# Patient Record
Sex: Male | Born: 1945 | Race: White | Hispanic: No | Marital: Married | Smoking: Never smoker
Health system: Southern US, Community
[De-identification: ages and names within clinical notes are randomized; demographics above are authoritative.]

## PROBLEM LIST (undated history)

## (undated) DIAGNOSIS — C61 Malignant neoplasm of prostate: Secondary | ICD-10-CM

## (undated) DIAGNOSIS — E785 Hyperlipidemia, unspecified: Secondary | ICD-10-CM

## (undated) DIAGNOSIS — G20A1 Parkinson's disease without dyskinesia, without mention of fluctuations: Secondary | ICD-10-CM

## (undated) DIAGNOSIS — I10 Essential (primary) hypertension: Secondary | ICD-10-CM

## (undated) DIAGNOSIS — G20 Parkinson's disease: Secondary | ICD-10-CM

## (undated) HISTORY — DX: Malignant neoplasm of prostate: C61

## (undated) HISTORY — DX: Parkinson's disease: G20

## (undated) HISTORY — DX: Hyperlipidemia, unspecified: E78.5

## (undated) HISTORY — DX: Parkinson's disease without dyskinesia, without mention of fluctuations: G20.A1

## (undated) HISTORY — DX: Essential (primary) hypertension: I10

---

## 1972-03-22 HISTORY — PX: HERNIA REPAIR: SHX51

## 1997-12-20 ENCOUNTER — Encounter: Payer: Self-pay | Admitting: Family Medicine

## 1997-12-20 LAB — CONVERTED CEMR LAB: PSA: 1 ng/mL

## 1999-01-21 ENCOUNTER — Encounter: Payer: Self-pay | Admitting: Family Medicine

## 1999-01-21 LAB — CONVERTED CEMR LAB: PSA: 0.9 ng/mL

## 2000-01-21 ENCOUNTER — Encounter: Payer: Self-pay | Admitting: Family Medicine

## 2000-01-21 LAB — CONVERTED CEMR LAB: PSA: 1.2 ng/mL

## 2001-09-28 ENCOUNTER — Encounter: Payer: Self-pay | Admitting: Family Medicine

## 2001-09-28 LAB — CONVERTED CEMR LAB: PSA: 7.6 ng/mL

## 2002-02-27 HISTORY — PX: PROSTATE BIOPSY: SHX241

## 2002-08-21 ENCOUNTER — Encounter: Payer: Self-pay | Admitting: Family Medicine

## 2002-08-21 LAB — CONVERTED CEMR LAB: PSA: 0.7 ng/mL

## 2003-01-21 ENCOUNTER — Encounter: Payer: Self-pay | Admitting: Family Medicine

## 2003-01-21 LAB — CONVERTED CEMR LAB
PSA: 0.6 ng/mL
PSA: 0.6 ng/mL

## 2004-07-20 ENCOUNTER — Encounter: Payer: Self-pay | Admitting: Family Medicine

## 2004-07-20 LAB — CONVERTED CEMR LAB: PSA: 0.83 ng/mL

## 2004-07-28 ENCOUNTER — Ambulatory Visit: Payer: Self-pay | Admitting: Family Medicine

## 2004-08-03 ENCOUNTER — Ambulatory Visit: Payer: Self-pay | Admitting: Family Medicine

## 2004-12-20 HISTORY — PX: CATARACT EXTRACTION: SUR2

## 2005-02-08 ENCOUNTER — Ambulatory Visit: Payer: Self-pay | Admitting: Ophthalmology

## 2005-02-19 HISTORY — PX: CATARACT EXTRACTION: SUR2

## 2005-03-01 ENCOUNTER — Ambulatory Visit: Payer: Self-pay | Admitting: Ophthalmology

## 2005-10-20 ENCOUNTER — Encounter: Payer: Self-pay | Admitting: Family Medicine

## 2005-10-20 LAB — CONVERTED CEMR LAB: PSA: 0.86 ng/mL

## 2005-11-10 ENCOUNTER — Ambulatory Visit: Payer: Self-pay | Admitting: Family Medicine

## 2005-11-15 ENCOUNTER — Ambulatory Visit: Payer: Self-pay | Admitting: Family Medicine

## 2005-11-20 LAB — FECAL OCCULT BLOOD, GUAIAC: Fecal Occult Blood: NEGATIVE

## 2005-12-06 ENCOUNTER — Ambulatory Visit: Payer: Self-pay | Admitting: Family Medicine

## 2006-01-14 ENCOUNTER — Ambulatory Visit: Payer: Self-pay | Admitting: Family Medicine

## 2006-01-18 ENCOUNTER — Ambulatory Visit: Payer: Self-pay | Admitting: Family Medicine

## 2006-04-20 ENCOUNTER — Ambulatory Visit: Payer: Self-pay | Admitting: Family Medicine

## 2006-07-14 ENCOUNTER — Ambulatory Visit: Payer: Self-pay | Admitting: Family Medicine

## 2006-07-28 ENCOUNTER — Ambulatory Visit: Payer: Self-pay | Admitting: Internal Medicine

## 2006-10-28 LAB — CONVERTED CEMR LAB: PSA: 1.2 ng/mL

## 2006-11-09 ENCOUNTER — Encounter: Payer: Self-pay | Admitting: Family Medicine

## 2006-11-09 DIAGNOSIS — E785 Hyperlipidemia, unspecified: Secondary | ICD-10-CM | POA: Insufficient documentation

## 2006-11-09 DIAGNOSIS — I10 Essential (primary) hypertension: Secondary | ICD-10-CM | POA: Insufficient documentation

## 2006-11-09 DIAGNOSIS — M109 Gout, unspecified: Secondary | ICD-10-CM | POA: Insufficient documentation

## 2006-11-18 ENCOUNTER — Ambulatory Visit: Payer: Self-pay | Admitting: Family Medicine

## 2006-11-23 ENCOUNTER — Ambulatory Visit: Payer: Self-pay | Admitting: Family Medicine

## 2007-05-23 ENCOUNTER — Ambulatory Visit: Payer: Self-pay | Admitting: Family Medicine

## 2007-05-23 DIAGNOSIS — M25519 Pain in unspecified shoulder: Secondary | ICD-10-CM | POA: Insufficient documentation

## 2007-11-21 ENCOUNTER — Ambulatory Visit: Payer: Self-pay | Admitting: Family Medicine

## 2007-11-22 ENCOUNTER — Encounter: Payer: Self-pay | Admitting: Family Medicine

## 2007-11-30 ENCOUNTER — Ambulatory Visit: Payer: Self-pay | Admitting: Family Medicine

## 2007-12-04 ENCOUNTER — Encounter: Payer: Self-pay | Admitting: Family Medicine

## 2007-12-04 DIAGNOSIS — M19049 Primary osteoarthritis, unspecified hand: Secondary | ICD-10-CM | POA: Insufficient documentation

## 2008-05-09 ENCOUNTER — Ambulatory Visit: Payer: Self-pay | Admitting: Family Medicine

## 2008-11-28 ENCOUNTER — Ambulatory Visit: Payer: Self-pay | Admitting: Family Medicine

## 2008-11-28 LAB — CONVERTED CEMR LAB: PSA: 3.3 ng/mL

## 2008-12-16 ENCOUNTER — Ambulatory Visit: Payer: Self-pay | Admitting: Family Medicine

## 2008-12-17 ENCOUNTER — Telehealth: Payer: Self-pay | Admitting: Family Medicine

## 2008-12-18 ENCOUNTER — Telehealth: Payer: Self-pay | Admitting: Family Medicine

## 2009-03-07 ENCOUNTER — Ambulatory Visit: Payer: Self-pay | Admitting: Family Medicine

## 2009-03-08 LAB — CONVERTED CEMR LAB: PSA: 3.9 ng/mL

## 2009-03-10 ENCOUNTER — Ambulatory Visit: Payer: Self-pay | Admitting: Family Medicine

## 2009-04-14 ENCOUNTER — Encounter: Payer: Self-pay | Admitting: Family Medicine

## 2009-04-21 ENCOUNTER — Ambulatory Visit: Payer: Self-pay | Admitting: Family Medicine

## 2009-05-20 ENCOUNTER — Ambulatory Visit: Payer: Self-pay | Admitting: Urology

## 2009-05-21 ENCOUNTER — Telehealth (INDEPENDENT_AMBULATORY_CARE_PROVIDER_SITE_OTHER): Payer: Self-pay | Admitting: *Deleted

## 2009-05-25 HISTORY — PX: PROSTATECTOMY: SHX69

## 2009-05-26 ENCOUNTER — Ambulatory Visit: Payer: Self-pay | Admitting: Urology

## 2009-07-14 ENCOUNTER — Telehealth: Payer: Self-pay | Admitting: Family Medicine

## 2009-07-23 ENCOUNTER — Ambulatory Visit: Payer: Self-pay | Admitting: Family Medicine

## 2009-07-24 ENCOUNTER — Encounter: Payer: Self-pay | Admitting: Family Medicine

## 2009-10-27 ENCOUNTER — Encounter (INDEPENDENT_AMBULATORY_CARE_PROVIDER_SITE_OTHER): Payer: Self-pay | Admitting: *Deleted

## 2010-01-06 ENCOUNTER — Encounter: Payer: Self-pay | Admitting: Family Medicine

## 2010-01-14 ENCOUNTER — Ambulatory Visit: Payer: Self-pay | Admitting: Internal Medicine

## 2010-04-21 NOTE — Assessment & Plan Note (Signed)
Summary: 6 MONTH FOLLOW UP/[facility]H   Vital Signs:  Patient Profile:   65 Years Old Male Height:     69 inches (175.26 cm) Weight:      225 pounds Temp:     98.2 degrees F oral Pulse rate:   64 / minute Pulse rhythm:   regular BP sitting:   120 / 80  (left arm) Cuff size:   large  Vitals Entered By: Providence Crosby (May 23, 2007 12:21 PM)                 Chief Complaint:  fell hit left arm shoulder canhardly move it today.  History of Present Illness: Here for his usual six ,month f/u, but fell and hit his shoulder, fell yesterday on ice, slipped and caught himself on outstretched palm which impacted his shoulder. Actually caught himself on both wrists but left absorbed most of the impact.  Otherwise has been doing well with no complaints. Fatigue has been pretty good lately. No probs of which he is aware with his BP.    Prior Medications Reviewed Using: Patient Recall  Current Allergies: No known allergies       Physical Exam  General:     Well-developed,well-nourished,in no acute distress; alert,appropriate and cooperative throughout examination Head:     Normocephalic and atraumatic without obvious abnormalities. No apparent alopecia or balding. Eyes:     Conjunctiva clear bilaterally.  Ears:     External ear exam shows no significant lesions or deformities.  Otoscopic examination reveals clear canals, tympanic membranes are intact bilaterally without bulging, retraction, inflammation or discharge. Hearing is grossly normal bilaterally. Nose:     External nasal examination shows no deformity or inflammation. Nasal mucosa are pink and moist without lesions or exudates. Mouth:     Oral mucosa and oropharynx without lesions or exudates.  Teeth in good repair. Neck:     No deformities, masses, or tenderness noted. Chest Wall:     No deformities, masses, tenderness or gynecomastia noted. Lungs:     Normal respiratory effort, chest expands symmetrically. Lungs are  clear to auscultation, no crackles or wheezes. Heart:     Normal rate and regular rhythm. S1 and S2 normal without gallop, murmur, click, rub or other extra sounds. Abdomen:     Bowel sounds positive,abdomen soft and non-tender without masses, organomegaly or hernias noted. Msk:     Shoulders look symmetric with no ant or post subluxation noted. Discomfort is in the rotator cuff area of the left shoulder but most difficulty is with raising arm outstretched in front...cannot get bove 15 degrees without aid of other hand and arm.  Posterior extension reasonably normal, backpocket manuever slightly uncomfortable. Extremities:     No clubbing, cyanosis, edema, or deformity noted with normal full range of motion of all joints except left shoulder.    Impression & Recommendations:  Problem # 1:  SHOULDER PAIN, LEFT (ICD-719.41) Assessment: New Could have been dislocation that reset itself or merely acute strain of shoulder girdle from impact...will give time, use Aleeve 2 after brfst and supper and ice 20 mins at a time every hour for two days and heat  first thing in the AM and a few times a day after the 48 hrs of ice.  Problem # 2:  HYPERTENSION (ICD-401.9) Assessment: Unchanged Stable. His updated medication list for this problem includes:    Metoprolol-hydrochlorothiazide 50-25 Mg Tabs (Metoprolol-hydrochlorothiazide) .Marland Kitchen... Take 1  tablet by mouth at bedtime  BP today: 120/80  Prior BP: 120/80 (11/23/2006)   Problem # 3:  SYMPTOM, MALAISE AND FATIGUE NEC (ICD-780.79) Assessment: Improved Back to baseline.  Complete Medication List: 1)  Metoprolol-hydrochlorothiazide 50-25 Mg Tabs (Metoprolol-hydrochlorothiazide) .... Take 1  tablet by mouth at bedtime 2)  Lipitor 20 Mg Tabs (Atorvastatin calcium) .... Take 1 tablet by mouth at bedtime 3)  Proscar 5 Mg Tabs (Finasteride) .... Take 1 tablet by mouth once a day 4)  Colchicine 0.6 Mg Tabs (Colchicine) .Marland Kitchen.. 1 tab po twice a day as needed  for severe gout pain   Patient Instructions: 1)  RTC 9/09 for complete exam, Labs prior. 2)  Ortho of choice is [facility].    ]

## 2010-04-21 NOTE — Letter (Signed)
Summary: Tristan Booker letter  [place] at [facility]  [address] [place], [place] [address]   Phone: [phone]  Fax: [phone]       10/27/2009 MRN: [id number]  [facility] [address] [place], [place]  [address]  Dear Tristan Booker,  [facility] [place] and [place] announce the retirement of Tristan Booker, M.D., from full-time practice at the [facility] office effective September 18, 2009 and his plans of returning part-time.  It is important to Tristan Booker and to our practice that you understand that [facility] has seven physicians in our office for your health care needs.  We will continue to offer the same exceptional care that you have today.    Tristan Booker has spoken to many of you about his plans for retirement and returning part-time in the fall.   We will continue to work with you through the transition to schedule appointments for you in the office and meet the high standards that [place] is committed to.   Again, it is with great pleasure that we share the news that Tristan Booker will return to [facility] in October of 2011 with a reduced schedule.    If you have any questions, or would like to request an appointment with one of our physicians, please call us at [phone] and press the option for Scheduling an appointment.  We take pleasure in providing you with excellent patient care and look forward to seeing you at your next office visit.  Our [facility] Physicians are:  Tristan Booker, M.D. Tristan Booker, M.D. Tristan Booker, M.D. Tristan Booker, M.D. Tristan Booker, M.D. Tristan Booker, M.D. We proudly welcomed Tristan Booker, M.D. and Tristan Booker, M.D. to the practice in July/August 2011.  Sincerely,  [facility]

## 2010-04-21 NOTE — Assessment & Plan Note (Signed)
Summary: CPX/CLE  R/S FROM 12/02/08   Vital Signs:  Patient profile:   65 year old male Height:      68.5 inches Weight:      223 pounds BMI:     33.53 Temp:     98.2 degrees F oral Pulse rate:   60 / minute Pulse rhythm:   regular BP sitting:   134 / 78  (left arm) Cuff size:   large  Vitals Entered By: Sydell Axon LPN (December 16, 2008 10:40 AM) CC: 30 Minute check-up, hemoccult cards given to patient   History of Present Illness: Pt here for Comp Exam, no complkaints except for aches and pains of aging.  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 One Tab By Mouth At Night 2)  Lipitor 20 Mg Tabs (Atorvastatin Calcium) .... Take 1 Tablet By Mouth At Bedtime 3)  Proscar 5 Mg Tabs (Finasteride) .... Take 1 Tablet By Mouth Once A Day 4)  Colchicine 0.6 Mg Tabs (Colchicine) .Marland Kitchen.. 1 Tab Po Twice A Day As Needed For Severe Gout Pain 5)  Mobic 7.5 Mg Tabs (Meloxicam) .... Daily As Needed Dr. Hyacinth Meeker 6)  Hydrochlorothiazide 12.5 Mg Caps (Hydrochlorothiazide) .... One Tab By Mouth in Am  Allergies: No Known Drug Allergies  Past History:  Past Medical History: Last updated: 11/30/2007 Gout Hyperlipidemia Hypertension Benign prostatic hypertrophy  Past Surgical History: Last updated:  11/30/2007 Hernia repair L 1974 Prostate bx x 6 neg 03/09/2002 Catarract R 12/2004 Catarract L 02/2005  Family History: Last updated: 12/16/2008 Father A 83 Emphysema (Smoker) Mother A 40 Breast Ca s/p Mastect 80s Brother A 62 Arthritis  Social History: Last updated: 11/23/2006 Occupation: Education administrator and window replacement, partime Married Lives with wife   3 Children  Risk Factors: Alcohol Use: 1 (12/16/2008) Caffeine Use: 1 (12/16/2008) Exercise: no (12/16/2008)  Risk Factors: Smoking Status: never (12/16/2008) Passive Smoke Exposure: no (12/16/2008)  Family History: Father A 40 Emphysema (Smoker) Mother A 66 Breast Ca s/p Mastect 77s Brother A 63 Arthritis  Review of Systems General:  Denies chills, fatigue, fever, loss of appetite, malaise, sleep disorder, sweats, weakness, and weight loss. Eyes:  Denies blurring, discharge, double vision, eye irritation, eye pain, halos, itching, light sensitivity, red eye, vision loss-1 eye, and vision loss-both eyes. ENT:  Complains of ringing in ears; denies decreased hearing, difficulty swallowing, ear discharge, earache, hoarseness, nasal congestion, nosebleeds, postnasal drainage, sinus pressure, and sore throat. CV:  Denies bluish discoloration of lips or nails, chest pain or discomfort, difficulty breathing at night, difficulty breathing while lying down, fainting, fatigue, leg cramps with exertion, lightheadness, near fainting, palpitations, shortness of breath with exertion, swelling of feet, swelling of hands, and weight gain. Resp:  Denies chest discomfort, chest pain with inspiration, cough,  coughing up blood, excessive snoring, hypersomnolence, morning headaches, pleuritic, shortness of breath, sputum productive, and wheezing. GI:  Complains of hemorrhoids; denies abdominal pain, bloody stools, change in bowel habits, constipation, dark tarry stools, diarrhea, excessive appetite, gas, indigestion, loss of  appetite, nausea, vomiting, vomiting blood, and yellowish skin color; occas BRBPR . GU:  Denies decreased libido, discharge, dysuria, erectile dysfunction, genital sores, hematuria, incontinence, nocturia, urinary frequency, and urinary hesitancy. MS:  Complains of joint pain and muscle; See HPI. Derm:  Complains of dryness; denies changes in color of skin, changes in nail beds, excessive perspiration, flushing, hair loss, insect bite(s), itching, lesion(s), poor wound healing, and rash; around the eyes, chronic. Neuro:  Denies brief paralysis, difficulty with concentration, disturbances in coordination, falling down, headaches, inability to speak, memory loss, numbness, poor balance, seizures, sensation of room spinning, tingling, tremors, visual disturbances, and weakness.  Physical Exam  General:  Well-developed,well-nourished,in no acute distress; alert,appropriate and cooperative throughout examination Head:  Normocephalic and atraumatic without obvious abnormalities. No apparent alopecia or balding. Thinning of hair midline. Eyes:  Conjunctiva clear bilaterally.  Ears:  External ear exam shows no significant lesions or deformities.  Otoscopic examination reveals clear canals, tympanic membranes are intact bilaterally without bulging, retraction, inflammation or discharge. Hearing is grossly normal bilaterally. Nose:  External nasal examination shows no deformity or inflammation. Nasal mucosa are pink and moist without lesions or exudates. Mouth:  Oral mucosa and oropharynx without lesions or exudates.  Teeth in good repair. Neck:  No deformities, masses, or tenderness noted. Chest Wall:  No deformities, masses, tenderness or gynecomastia noted. Breasts:  No masses or gynecomastia noted Lungs:  Normal respiratory effort, chest expands symmetrically. Lungs are clear to auscultation, no crackles or wheezes. Heart:  Normal rate and regular rhythm. S1 and S2 normal without gallop, murmur, click,  rub or other extra sounds. Abdomen:  Bowel sounds positive,abdomen soft and non-tender without masses, organomegaly or hernias noted. Rectal:  No external abnormalities noted. Normal sphincter tone. No rectal masses or tenderness. G neg. Genitalia:  Testes bilaterally descended without nodularity, tenderness or masses. No scrotal masses or lesions. No penis lesions or urethral discharge. Prostate:  Prostate gland firm and smooth, no enlargement, nodularity, tenderness, mass, asymmetry or induration. 10 gms. Msk:  No deformity or scoliosis noted of thoracic or lumbar spine.   Left shoulder tender over the A/C joint area with mild discomfort on ROM of the upper deltoid fiber area. Pulses:  R and L carotid,radial,femoral,dorsalis pedis and posterior tibial pulses are full and equal bilaterally Extremities:  No clubbing, cyanosis, edema, or deformity noted with normal full range of motion of all joints except mild swelling, mild decreased ROM and significant tenderness to direct palpation of the middle finger MCP joint. Neurologic:  No cranial nerve deficits noted. Station and gait are normal. Plantar reflexes are down-going bilaterally. DTRs are symmetrical throughout. Sensory, motor and coordinative functions appear intact. Skin:  Intact without suspicious lesions or rashes Cervical Nodes:  No lymphadenopathy noted Inguinal Nodes:  No significant adenopathy Psych:  Cognition and judgment appear intact. Alert and cooperative with normal attention span and concentration. No apparent delusions, illusions, hallucinations   Impression & Recommendations:  Problem # 1:  HEALTH MAINTENANCE EXAM (ICD-V70.0) Assessment Comment Only  Problem # 2:  PSA, INCREASED (ICD-790.93) Assessment: New Recheck in 3 mos, no s/s of infection sop will not treat.  Problem # 3:  HAND PAIN, RIGHT (ICD-729.5) Assessment: Unchanged Stable but there.  Problem # 4:  SHOULDER PAIN,  LEFT (ICD-719.41) Assessment:  Unchanged Continues. The following medications were removed from the medication list:    Mobic 7.5 Mg Tabs (Meloxicam) .Marland Kitchen... Daily as needed dr. Hyacinth Meeker  Problem # 5:  PROSTATITIS NOS (ICD-601.9) Assessment: Unchanged No clinical sxs today, no clinical evidence today.  Problem # 6:  HYPERTENSION (ICD-401.9) Assessment: Improved Cont curr meds. His updated medication list for this problem includes:    Metoprolol Succinate 100 Mg Xr24h-tab (Metoprolol succinate) ..... One tab by mouth at night    Hydrochlorothiazide 12.5 Mg Caps (Hydrochlorothiazide) ..... One tab by mouth in am  BP today: 134/78 Prior BP: 144/84 (05/09/2008)  Problem # 7:  HYPERLIPIDEMIA (ICD-272.4) Assessment: Unchanged Stable. His updated medication list for this problem includes:    Lipitor 20 Mg Tabs (Atorvastatin calcium) .Marland Kitchen... Take 1 tablet by mouth at bedtime  Problem # 8:  GOUT (ICD-274.9) Assessment: Unchanged  U.A high but tolerating clinically. The following medications were removed from the medication list:    Mobic 7.5 Mg Tabs (Meloxicam) .Marland Kitchen... Daily as needed dr. Hyacinth Meeker His updated medication list for this problem includes:    Colchicine 0.6 Mg Tabs (Colchicine) .Marland Kitchen... 1 tab by mouthtwice a day as needed for severe gout pain  Complete Medication List: 1)  Metoprolol Succinate 100 Mg Xr24h-tab (Metoprolol succinate) .... One tab by mouth at night 2)  Lipitor 20 Mg Tabs (Atorvastatin calcium) .... Take 1 tablet by mouth at bedtime 3)  Proscar 5 Mg Tabs (Finasteride) .... Take 1 tablet by mouth once a day 4)  Colchicine 0.6 Mg Tabs (Colchicine) .Marland Kitchen.. 1 tab by mouthtwice a day as needed for severe gout pain 5)  Hydrochlorothiazide 12.5 Mg Caps (Hydrochlorothiazide) .... One tab by mouth in am  Patient Instructions: 1)  RTC 3 mos PSA prior 790.93 Prescriptions: HYDROCHLOROTHIAZIDE 12.5 MG CAPS (HYDROCHLOROTHIAZIDE) one tab by mouth in AM  #90 x 3   Entered by:   Sydell Axon LPN   Authorized by:    Shaune Leeks MD   Signed by:   Sydell Axon LPN on 16/12/9602   Method used:   Print then Give to Patient   RxID:   [id number] PROSCAR 5 MG TABS (FINASTERIDE) Take 1 tablet by mouth once a day  #90 x 3   Entered by:   Sydell Axon LPN   Authorized by:   Shaune Leeks MD   Signed by:   Sydell Axon LPN on 21/30/8657   Method used:   Print then Give to Patient   RxID:   [id number] LIPITOR 20 MG TABS (ATORVASTATIN CALCIUM) Take 1 tablet by mouth at bedtime  #90 x 3   Entered by:   Sydell Axon LPN   Authorized by:   Shaune Leeks MD   Signed by:   Sydell Axon LPN on 03/24/7251   Method used:   Print then Give to Patient   RxID:   [id number] METOPROLOL SUCCINATE 100 MG XR24H-TAB (METOPROLOL SUCCINATE) one tab by mouth at night  #90 x 3   Entered by:   Sydell Axon LPN   Authorized by:   Shaune Leeks MD   Signed by:   Sydell Axon LPN on 75/64/3329   Method used:   Print then Give to Patient   RxID:   [id number]   Current Allergies (reviewed today): No known allergies

## 2010-04-21 NOTE — Assessment & Plan Note (Signed)
Summary: CPX/EVE   Vital Signs:  Patient Profile:   65 Years Old Male Height:     69 inches (175.26 cm) Weight:      221 pounds Temp:     98.8 degrees F oral Pulse rate:   68 / minute Pulse rhythm:   regular BP sitting:   120 / 80  (left arm) Cuff size:   large  Vitals Entered By: Providence Crosby (November 23, 2006 8:27 AM)                 Chief Complaint:  CHECK UP // HEMOCULT CARDS TO PATIENT.  History of Present Illness: Here for Comp Exam, no complaints...feels well.  Current Allergies: No known allergies    Family History:    Father A 26 Emphysema (Smoker)    Mother A 55 Breast Ca s/p Mastect 52s    Brother A 60 Arthritis  Social History:    Occupation: Education administrator and window replacement, partime    Married Lives with wife   3 Children   Risk Factors:  Passive smoke exposure:  no Drug use:  no HIV high-risk behavior:  no Caffeine use:  1 drinks per day Alcohol use:  yes    Type:  beer     Drinks per day:  1    Has patient --       Felt need to cut down:  no       Been annoyed by complaints:  no       Felt guilty about drinking:  no       Needed eye opener in the morning:  no    Counseled to quit/cut down alcohol use:  no Exercise:  no Seatbelt use:  100 %   Review of Systems  General      Denies chills, fatigue, fever, loss of appetite, malaise, sleep disorder, sweats, weakness, and weight loss.  Eyes      Denies blurring, discharge, double vision, eye irritation, eye pain, halos, itching, light sensitivity, red eye, vision loss-1 eye, and vision loss-both eyes.  ENT      Complains of ringing in ears.      Denies decreased hearing, difficulty swallowing, ear discharge, earache, hoarseness, nasal congestion, nosebleeds, postnasal drainage, sinus pressure, and sore throat.      chronic  CV      Denies bluish discoloration of lips or nails, chest pain or discomfort, difficulty breathing at night, difficulty breathing while lying  down, fainting, fatigue, leg cramps with exertion, lightheadness, near fainting, palpitations, shortness of breath with exertion, swelling of feet, swelling of hands, and weight gain.  Resp      Denies chest discomfort, chest pain with inspiration, cough, coughing up blood, excessive snoring, hypersomnolence, morning headaches, pleuritic, shortness of breath, sputum productive, and wheezing.  GI      Denies abdominal pain, bloody stools, change in bowel habits, constipation, dark tarry stools, diarrhea, excessive appetite, gas, hemorrhoids, indigestion, loss of appetite, nausea, vomiting, vomiting blood, and yellowish skin color.  GU      Denies decreased libido, discharge, dysuria, erectile dysfunction, genital sores, hematuria, incontinence, nocturia, urinary frequency, and urinary hesitancy.  MS      Denies joint pain, joint redness, joint swelling, loss of strength, low back pain, mid back pain, muscle aches, muscle , cramps, muscle weakness, stiffness, and thoracic pain.  Derm      Denies changes in color of skin, changes in nail beds, dryness, excessive perspiration, flushing, hair loss,  insect bite(s), itching, lesion(s), poor wound healing, and rash.  Neuro      Denies brief paralysis, difficulty with concentration, disturbances in coordination, falling down, headaches, inability to speak, memory loss, numbness, poor balance, seizures, sensation of room spinning, tingling, tremors, visual disturbances, and weakness.   Physical Exam  General:     Well-developed,well-nourished,in no acute distress; alert,appropriate and cooperative throughout examination Head:     Normocephalic and atraumatic without obvious abnormalities. No apparent alopecia or balding. Eyes:     Conjunctiva clear bilaterally.  Ears:     External ear exam shows no significant lesions or deformities.  Otoscopic examination reveals clear canals, tympanic membranes are intact bilaterally without bulging,  retraction, inflammation or discharge. Hearing is grossly normal bilaterally. Nose:     External nasal examination shows no deformity or inflammation. Nasal mucosa are pink and moist without lesions or exudates. Mouth:     Oral mucosa and oropharynx without lesions or exudates.  Teeth in good repair. Neck:     No deformities, masses, or tenderness noted. Chest Wall:     no tenderness.   Breasts:     No masses or gynecomastia noted Lungs:     Normal respiratory effort, chest expands symmetrically. Lungs are clear to auscultation, no crackles or wheezes. Heart:     Normal rate and regular rhythm. S1 and S2 normal without gallop, murmur, click, rub or other extra sounds. Abdomen:     Bowel sounds positive,abdomen soft and non-tender without masses, organomegaly or hernias noted. Rectal:     No external abnormalities noted. Normal sphincter tone. No rectal masses or tenderness. Gneg. Genitalia:     Testes bilaterally descended without nodularity, tenderness or masses. No scrotal masses or lesions. No penis lesions or urethral discharge. Prostate:     Prostate gland firm and smooth, no enlargement, nodularity, tenderness, mass, asymmetry or induration. 10gms. Msk:     No deformity or scoliosis noted of thoracic or lumbar spine.   Pulses:     R and L carotid,radial,femoral,dorsalis pedis and posterior tibial pulses are full and equal bilaterally Extremities:     No clubbing, cyanosis, edema, or deformity noted with normal full range of motion of all joints.   Neurologic:     No cranial nerve deficits noted. Station and gait are normal. Plantar reflexes are down-going bilaterally. DTRs are symmetrical throughout. Sensory, motor and coordinative functions appear intact. Skin:     Intact without suspicious lesions or rashes except mild AKs and benign moles. Cervical Nodes:     No lymphadenopathy noted Inguinal Nodes:     No significant adenopathy Psych:     Cognition and judgment appear  intact. Alert and cooperative with normal attention span and concentration. No apparent delusions, illusions, hallucinations    Impression & Recommendations:  Problem # 1:  HEALTH MAINTENANCE EXAM (ICD-V70.0) Assessment: Comment Only  Problem # 2:  SYMPTOM, MALAISE AND FATIGUE NEC (ICD-780.79) Assessment: Unchanged Labs normal.  Problem # 3:  BENIGN PROSTATIC HYPERTROPHY (ICD-600.00) Assessment: Improved Continue Proscar.  Problem # 4:  HYPERTENSION (ICD-401.9) Assessment: Improved Cont current meds. His updated medication list for this problem includes:    Metoprolol-hydrochlorothiazide 50-25 Mg Tabs (Metoprolol-hydrochlorothiazide) .Marland Kitchen... Take 1  tablet by mouth at bedtime  BP today: 120/80 Prior BP: 132/70 (07/28/2006)   Problem # 5:  HYPERLIPIDEMIA (ICD-272.4) Good control. His updated medication list for this problem includes:    Lipitor 20 Mg Tabs (Atorvastatin calcium) .Marland Kitchen... Take 1 tablet by mouth at bedtime   Problem #  6:  GOUT (ICD-274.9) Assessment: Unchanged Uric acid elevated but acute attacks have not been real frequent...will continue as needed colchicine. His updated medication list for this problem includes:    Colchicine 0.6 Mg Tabs (Colchicine) .Marland Kitchen... 1 tab po twice a day as needed for severe gout pain Elevate extremity; warm compresses, symptomatic relief and medication as directed.   Complete Medication List: 1)  Metoprolol-hydrochlorothiazide 50-25 Mg Tabs (Metoprolol-hydrochlorothiazide) .... Take 1  tablet by mouth at bedtime 2)  Lipitor 20 Mg Tabs (Atorvastatin calcium) .... Take 1 tablet by mouth at bedtime 3)  Proscar 5 Mg Tabs (Finasteride) .... Take 1 tablet by mouth once a day 4)  Colchicine 0.6 Mg Tabs (Colchicine) .Marland Kitchen.. 1 tab po twice a day as needed for severe gout pain   Patient Instructions: 1)  RTC 6 mos    Prescriptions: COLCHICINE 0.6 MG TABS (COLCHICINE) 1 three times a day as needed for severe gout pain  #60 x 0   Entered by:    Providence Crosby   Authorized by:   Shaune Leeks MD   Signed by:   Providence Crosby on 11/23/2006   Method used:   Print then Give to Patient   RxID:   [id number] PROSCAR 5 MG TABS (FINASTERIDE) Take 1 tablet by mouth once a day  #90 x 4   Entered by:   Providence Crosby   Authorized by:   Shaune Leeks MD   Signed by:   Providence Crosby on 11/23/2006   Method used:   Print then Give to Patient   RxID:   [id number] LIPITOR 20 MG TABS (ATORVASTATIN CALCIUM) Take 1 tablet by mouth at bedtime  #90 x 4   Entered by:   Providence Crosby   Authorized by:   Shaune Leeks MD   Signed by:   Providence Crosby on 11/23/2006   Method used:   Print then Give to Patient   RxID:   [id number] METOPROLOL-HYDROCHLOROTHIAZIDE 50-25 MG TABS (METOPROLOL-HYDROCHLOROTHIAZIDE) Take 1  tablet by mouth at bedtime  #90 x 4   Entered by:   Providence Crosby   Authorized by:   Shaune Leeks MD   Signed by:   Providence Crosby on 11/23/2006   Method used:   Print then Give to Patient   RxID:   [id number] METOPROLOL-HYDROCHLOROTHIAZIDE 50-25 MG TABS (METOPROLOL-HYDROCHLOROTHIAZIDE) Take 1  tablet by mouth at bedtime  #30 x 3   Entered by:   Providence Crosby   Authorized by:   Shaune Leeks MD   Signed by:   Providence Crosby on 11/23/2006   Method used:   Print then Give to Patient   RxID:   [id number]

## 2010-04-21 NOTE — Progress Notes (Signed)
Summary: Alternative for Lipitor  Phone Note From Pharmacy Call back at [phone]   Caller: [facility] Call For: Dr. Hetty Ely  Summary of Call: Received faxed form requesting an alternative for Lipitor.  Please advise form in your IN box. Initial call taken by: Linde Gillis CMA Duncan Dull),  July 14, 2009 11:33 AM  Follow-up for Phone Call        Pls have pt come in. Follow-up by: Shaune Leeks MD,  July 14, 2009 1:24 PM  Additional Follow-up for Phone Call Additional follow up Details #1::        Scheduled appt for patient on 07/23/2009 at 2:45. Additional Follow-up by: Linde Gillis CMA Duncan Dull),  July 14, 2009 2:05 PM

## 2010-04-21 NOTE — Assessment & Plan Note (Signed)
Summary: CPX/RBH   Vital Signs:  Patient Profile:   65 Years Old Male Height:     69 inches (175.26 cm) Weight:      223 pounds Temp:     98 degrees F oral Pulse rate:   56 / minute Pulse rhythm:   regular BP sitting:   140 / 80  (left arm) Cuff size:   large  Vitals Entered By: Providence Crosby (November 30, 2007 8:22 AM)                 Chief Complaint:  check up// hemoccult cards to patient.  History of Present Illness: Pt here for Comp Exam, Having continued left shoulder tightness after a fall six months ago....better but still hurts. Also has right hand pain. Longstanding but worsening discomfort of the third MCP joint, worse with direct palpation, both dorsal and palmar surfaces. His BP is also up a little today, no known reason other than being here early in AM for PE!    Prior Medications Reviewed Using: Medication Bottles  Current Allergies: No known allergies   Past Medical History:    Gout    Hyperlipidemia    Hypertension    Benign prostatic hypertrophy  Past Surgical History:    Hernia repair L 1974    Prostate bx x 6 neg 03/09/2002    Catarract R 12/2004    Catarract L 02/2005       Family History:    Father A 77 Emphysema (Smoker)    Mother A 33 Breast Ca s/p Mastect 60s    Brother A 62 Arthritis   Risk Factors:     Has patient --       Felt need to cut down:  no       Been annoyed by complaints:  no       Felt guilty about drinking:  no       Needed eye opener in the morning:  no    Counseled to quit/cut down alcohol use:  no   Review of Systems  Eyes      Denies blurring, discharge, double vision, eye irritation, eye pain, halos, itching, light sensitivity, red eye, vision loss-1 eye, and vision loss-both eyes.  ENT      Complains of ringing in ears.      Denies decreased hearing, difficulty swallowing, ear discharge, earache, hoarseness, nasal congestion, nosebleeds, postnasal drainage, sinus pressure, and sore throat.  longstanding  CV      Denies bluish discoloration of lips or nails, chest pain or discomfort, difficulty breathing at night, difficulty breathing while lying down, fainting, fatigue, leg cramps with exertion, lightheadness, near fainting, palpitations, shortness of breath with exertion, swelling of feet, swelling of hands, and weight gain.  Resp      Complains of wheezing.      Denies chest discomfort, chest pain with inspiration, cough, coughing up blood, excessive snoring, hypersomnolence, morning headaches, pleuritic, shortness of breath, and sputum productive.      late at night or early in the morning  GI      Denies abdominal pain, bloody stools, change in bowel habits, constipation, dark tarry stools, diarrhea, excessive appetite, gas, hemorrhoids, indigestion, loss of appetite, nausea, vomiting, vomiting blood, and yellowish skin color.  GU      Denies decreased libido, discharge, dysuria, erectile dysfunction, genital sores, hematuria, incontinence, nocturia, urinary frequency, and urinary hesitancy.  MS      Complains of joint pain.      right  hand and left shoulder.  Derm      Complains of dryness.      Denies changes in color of skin, changes in nail beds, excessive perspiration, flushing, hair loss, insect bite(s), itching, lesion(s), poor wound healing, and rash.      top of nose and eyebrows  Neuro      Denies brief paralysis, difficulty with concentration, disturbances in coordination, falling down, headaches, inability to speak, memory loss, numbness, poor balance, seizures, sensation of room spinning, tingling, tremors, visual disturbances, and weakness.   Physical Exam  General:     Well-developed,well-nourished,in no acute distress; alert,appropriate and cooperative throughout examination Head:     Normocephalic and atraumatic without obvious abnormalities. No apparent alopecia or balding. Eyes:     Conjunctiva clear bilaterally.  Ears:     External ear exam  shows no significant lesions or deformities.  Otoscopic examination reveals clear canals, tympanic membranes are intact bilaterally without bulging, retraction, inflammation or discharge. Hearing is grossly normal bilaterally. Nose:     External nasal examination shows no deformity or inflammation. Nasal mucosa are pink and moist without lesions or exudates. Mouth:     Oral mucosa and oropharynx without lesions or exudates.  Teeth in good repair. Neck:     No deformities, masses, or tenderness noted. Chest Wall:     No deformities, masses, tenderness or gynecomastia noted. Breasts:     No masses or gynecomastia noted Lungs:     Normal respiratory effort, chest expands symmetrically. Lungs are clear to auscultation, no crackles or wheezes. Heart:     Normal rate and regular rhythm. S1 and S2 normal without gallop, murmur, click, rub or other extra sounds. Abdomen:     Bowel sounds positive,abdomen soft and non-tender without masses, organomegaly or hernias noted. Rectal:     No external abnormalities noted. Normal sphincter tone. No rectal masses or tenderness. G neg. Genitalia:     Testes bilaterally descended without nodularity, tenderness or masses. No scrotal masses or lesions. No penis lesions or urethral discharge. Prostate:     Prostate gland firm and smooth, no enlargement, nodularity, tenderness, mass, asymmetry or induration. 10 gms. Msk:     No deformity or scoliosis noted of thoracic or lumbar spine.   Left shoulder tender over the A/C joint area with mild discomfort on ROM of the upper deltoid fiber area. Pulses:     R and L carotid,radial,femoral,dorsalis pedis and posterior tibial pulses are full and equal bilaterally Extremities:     No clubbing, cyanosis, edema, or deformity noted with normal full range of motion of all joints except mild swelling, mild decreased ROM and significant tenderness to direct palpation of the middle finger MCP joint. Neurologic:     No  cranial nerve deficits noted. Station and gait are normal. Plantar reflexes are down-going bilaterally. DTRs are symmetrical throughout. Sensory, motor and coordinative functions appear intact. Skin:     Intact without suspicious lesions or rashes Cervical Nodes:     No lymphadenopathy noted Inguinal Nodes:     No significant adenopathy Psych:     Cognition and judgment appear intact. Alert and cooperative with normal attention span and concentration. No apparent delusions, illusions, hallucinations    Impression & Recommendations:  Problem # 1:  HEALTH MAINTENANCE EXAM (ICD-V70.0) Assessment: Comment Only Tdap today.  Problem # 2:  HAND PAIN, RIGHT (ICD-729.5) Assessment: New New complaint but old problem per his history, getting progressively worse. Will refer. Orders: Orthopedic Referral (Ortho)   Problem #  3:  SHOULDER PAIN, LEFT (ICD-719.41) Assessment: Improved Start doing gentle ROM exercises as shown after heat application daily.  Problem # 4:  BENIGN PROSTATIC HYPERTROPHY (ICD-600.00) Assessment: Unchanged Stable, urinating well.  Problem # 5:  HYPERTENSION (ICD-401.9) Assessment: Deteriorated Mildly elevated today, will follow and adjust meds next time if still elevated. His updated medication list for this problem includes:    Metoprolol-hydrochlorothiazide 50-25 Mg Tabs (Metoprolol-hydrochlorothiazide) .Marland Kitchen... Take 1  tablet by mouth at bedtime  BP today: 140/80 Prior BP: 120/80 (05/23/2007)   Problem # 6:  HYPERLIPIDEMIA (ICD-272.4) Assessment: Unchanged Stable, trig mildly elevated...watch sweets and carbs. His updated medication list for this problem includes:    Lipitor 20 Mg Tabs (Atorvastatin calcium) .Marland Kitchen... Take 1 tablet by mouth at bedtime   Problem # 7:  GOUT (ICD-274.9) Assessment: Unchanged Uric acid high but flares sparse. Will follow. His updated medication list for this problem includes:    Colchicine 0.6 Mg Tabs (Colchicine) .Marland Kitchen... 1 tab po  twice a day as needed for severe gout pain   Complete Medication List: 1)  Metoprolol-hydrochlorothiazide 50-25 Mg Tabs (Metoprolol-hydrochlorothiazide) .... Take 1  tablet by mouth at bedtime 2)  Lipitor 20 Mg Tabs (Atorvastatin calcium) .... Take 1 tablet by mouth at bedtime 3)  Proscar 5 Mg Tabs (Finasteride) .... Take 1 tablet by mouth once a day 4)  Colchicine 0.6 Mg Tabs (Colchicine) .Marland Kitchen.. 1 tab po twice a day as needed for severe gout pain   Patient Instructions: 1)  Refer to Ortho for right hand 3rd MTP joint pain and swelling 2)  RTC 6 mos , check BP. 3)  Tdap today.   Prescriptions: PROSCAR 5 MG TABS (FINASTERIDE) Take 1 tablet by mouth once a day  #90 x 4   Entered by:   Providence Crosby   Authorized by:   Shaune Leeks MD   Signed by:   Providence Crosby on 11/30/2007   Method used:   Print then Give to Patient   RxID:   [id number] LIPITOR 20 MG TABS (ATORVASTATIN CALCIUM) Take 1 tablet by mouth at bedtime  #90 x 4   Entered by:   Providence Crosby   Authorized by:   Shaune Leeks MD   Signed by:   Providence Crosby on 11/30/2007   Method used:   Print then Give to Patient   RxID:   [id number] METOPROLOL-HYDROCHLOROTHIAZIDE 50-25 MG TABS (METOPROLOL-HYDROCHLOROTHIAZIDE) Take 1  tablet by mouth at bedtime  #90 x 4   Entered by:   Providence Crosby   Authorized by:   Shaune Leeks MD   Signed by:   Providence Crosby on 11/30/2007   Method used:   Print then Give to Patient   RxID:   [id number]  ]  Appended Document: CPX/RBH        Current Allergies: No known allergies         Complete Medication List: 1)  Metoprolol-hydrochlorothiazide 50-25 Mg Tabs (Metoprolol-hydrochlorothiazide) .... Take 1  tablet by mouth at bedtime 2)  Lipitor 20 Mg Tabs (Atorvastatin calcium) .... Take 1 tablet by mouth at bedtime 3)  Proscar 5 Mg Tabs (Finasteride) .... Take 1 tablet by mouth once a day 4)  Colchicine 0.6 Mg Tabs (Colchicine) .Marland Kitchen.. 1 tab po twice a day as  needed for severe gout pain    ]  Tetanus/Td Vaccine    Vaccine Type: Tdap    Site: right deltoid    Mfr: San[employer]i Pasteur    Dose: 0.5 ml  Route: IM    Given by: Providence Crosby    Exp. Date: 05/18/2009    Lot #: [id number]    VIS given: 02/07/07 version given November 30, 2007.

## 2010-04-21 NOTE — Assessment & Plan Note (Signed)
Summary: 6WK FOLLOW UP / LFW   Vital Signs:  Patient profile:   65 year old male Weight:      225.75 pounds Temp:     98.5 degrees F oral Pulse rate:   64 / minute Pulse rhythm:   regular BP sitting:   122 / 72  (left arm) Cuff size:   large  Vitals Entered By: Sydell Axon LPN (April 21, 2009 8:54 AM) CC: 6 week follow-up   History of Present Illness: Pt here for followup on elevated PSA that has been assymptomatic but treated for infection anyway and now back for recheck. His PSA is elevted further, 4.7 but with great free PSA. These results are conflicting. He has been seen by Dr Sheppard Penton in [place] previously with biopsies before. He has no complaints today.  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 One Tab By Mouth At Night 2)  Lipitor 20 Mg Tabs (Atorvastatin Calcium) .... Take 1 Tablet By Mouth At Bedtime 3)  Proscar 5 Mg Tabs (Finasteride) .... Take 1 Tablet By Mouth Once A Day 4)  Colchicine 0.6 Mg Tabs (Colchicine) .Marland Kitchen.. 1 Tab By Mouthtwice A Day As Needed For Severe Gout Pain 5)  Hydrochlorothiazide 12.5 Mg Caps (Hydrochlorothiazide) .... One Tab By Mouth in Am 6)  Cipro 500 Mg Tabs (Ciprofloxacin Hcl) .... One Tab By Mouth Two Times A Day  Allergies: No Known Drug Allergies  Physical Exam  General:  Well-developed,well-nourished,in no acute distress; alert,appropriate and cooperative throughout examination Head:  Normocephalic and atraumatic without obvious abnormalities. No apparent alopecia or  balding. Thinning of hair midline. Eyes:  Conjunctiva clear bilaterally.  Ears:  External ear exam shows no significant lesions or deformities.  Otoscopic examination reveals clear canals, tympanic membranes are intact bilaterally without bulging, retraction, inflammation or discharge. Hearing is grossly normal bilaterally. Nose:  External nasal examination shows no deformity or inflammation. Nasal mucosa are pink and moist without lesions or exudates. Mouth:  Oral mucosa and oropharynx without lesions or exudates.  Teeth in good repair.   Impression & Recommendations:  Problem # 1:  PSA, INCREASED (ICD-790.93) Assessment Deteriorated Has accelerated more. But free great. Will refer rather than watch. Pt agrees. Orders: Urology Referral (Urology)  Complete Medication List: 1)  Metoprolol Succinate 100 Mg Xr24h-tab (Metoprolol succinate) .... One tab by mouth at night 2)  Lipitor 20 Mg Tabs (Atorvastatin calcium) .... Take 1 tablet by mouth at bedtime 3)  Proscar 5 Mg Tabs (Finasteride) .... Take 1 tablet by mouth once a day 4)  Colchicine 0.6 Mg Tabs (Colchicine) .Marland Kitchen.. 1 tab by mouthtwice a day as needed for severe gout pain 5)  Hydrochlorothiazide 12.5 Mg Caps (Hydrochlorothiazide) .... One tab by mouth in am 6)  Cipro 500 Mg Tabs (Ciprofloxacin hcl) .... One tab by mouth two times a day  Patient Instructions: 1)  Refer to Dr Sheppard Penton in [place]. 2)  RTC as needed.  Current Allergies (reviewed today): No known allergies

## 2010-04-21 NOTE — Assessment & Plan Note (Signed)
Summary: RETURN OFFICE VISIT   Vital Signs:  Patient profile:   65 year old male Weight:      227 pounds Temp:     98.6 degrees F oral Pulse rate:   60 / minute Pulse rhythm:   regular BP sitting:   122 / 82  (left arm) Cuff size:   large  Vitals Entered By: Sydell Axon LPN (March 10, 2009 11:59 AM) CC: follow-up visit   History of Present Illness: Pt here for three month followup of elevated PSA. He has had this previously, was bioppsied and also put on Ab and biopsy was benign and PSA normalized on Ab. He has no rectal pain, no fever and feels well.  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 One Tab By Mouth At Night 2)  Lipitor 20 Mg Tabs (Atorvastatin Calcium) .... Take 1 Tablet By Mouth At Bedtime 3)  Proscar 5 Mg Tabs (Finasteride) .... Take 1 Tablet By Mouth Once A Day 4)  Colchicine 0.6 Mg Tabs (Colchicine) .Marland Kitchen.. 1 Tab By Mouthtwice A Day As Needed For Severe Gout Pain 5)  Hydrochlorothiazide 12.5 Mg Caps (Hydrochlorothiazide) .... One Tab By Mouth in Am  Allergies: No Known Drug Allergies  Physical Exam  General:  Well-developed,well-nourished,in no acute distress; alert,appropriate and cooperative throughout examination Head:  Normocephalic and atraumatic without obvious abnormalities. No apparent alopecia or balding. Thinning of hair midline. Eyes:  Conjunctiva clear bilaterally.  Ears:  External ear exam shows no significant lesions or deformities.  Otoscopic examination reveals clear canals,  tympanic membranes are intact bilaterally without bulging, retraction, inflammation or discharge. Hearing is grossly normal bilaterally. Nose:  External nasal examination shows no deformity or inflammation. Nasal mucosa are pink and moist without lesions or exudates. Mouth:  Oral mucosa and oropharynx without lesions or exudates.  Teeth in good repair.   Impression & Recommendations:  Problem # 1:  PROSTATITIS NOS (ICD-601.9) Assessment Unchanged Recurrent Will treat with AB for three weeks and then rechck PSA three weeks later. If still elevated, refer again to Urology.  Complete Medication List: 1)  Metoprolol Succinate 100 Mg Xr24h-tab (Metoprolol succinate) .... One tab by mouth at night 2)  Lipitor 20 Mg Tabs (Atorvastatin calcium) .... Take 1 tablet by mouth at bedtime 3)  Proscar 5 Mg Tabs (Finasteride) .... Take 1 tablet by mouth once a day 4)  Colchicine 0.6 Mg Tabs (Colchicine) .Marland Kitchen.. 1 tab by mouthtwice a day as needed for severe gout pain 5)  Hydrochlorothiazide 12.5 Mg Caps (Hydrochlorothiazide) .... One tab by mouth in am 6)  Cipro 500 Mg Tabs (Ciprofloxacin hcl) .... One tab by mouth two times a day  Patient Instructions: 1)  RTC 6 weeks, PSA , free PSA prior. Prescriptions: CIPRO 500 MG TABS (CIPROFLOXACIN HCL) one tab by mouth two times a day  #42 x 0   Entered and Authorized by:   Shaune Leeks MD   Signed by:   Shaune Leeks MD on 03/10/2009   Method used:   Electronically to  [facility]  [address] (retail)       [address], [address]       [place], [place]  [address]       Ph: [phone]       Fax: [phone]   RxID:   [id number]   Current Allergies (reviewed today): No known allergies

## 2010-04-21 NOTE — Consult Note (Signed)
Summary: [facility] RIGHT HAND PAIN / DR. HE  [facility] - RIGHT HAND PAIN / DR. HE MILLER   Imported By: Carin Primrose 12/08/2007 15:01:21  _____________________________________________________________________  External Attachment:    Type:   Image     Comment:   External Document  Appended Document:  [facility] - RIGHT HAND PAIN / DR. HE    Clinical Lists Changes  Problems: Added new problem of POSTTRAUMATIC ARTHOPATHY,R MIDDLE FINGER MCP JOINT [phone])

## 2010-04-21 NOTE — Progress Notes (Signed)
Summary: prior auth needed for lipitor  Phone Note From Pharmacy   Caller: [facility] [address] Summary of Call: Prior Berkley Harvey is needed for lipitor, form is on your shelf. Initial call taken by: Lowella Petties CMA,  December 18, 2008 12:21 PM  Follow-up for Phone Call        Pls send med list, prob list and last labs. Follow-up by: Shaune Leeks MD,  December 18, 2008 1:43 PM  Additional Follow-up for Phone Call Additional follow up Details #1::        Information faxed yesterday. Additional Follow-up by: Lowella Petties CMA,  December 20, 2008 10:13 AM

## 2010-04-21 NOTE — Assessment & Plan Note (Signed)
Summary: FOLLOW UP, DISCUSS ALTERNATIVE FOR LIPITOR/NT   Vital Signs:  Patient profile:   65 year old male Weight:      226 pounds Temp:     98.8 degrees F oral Pulse rate:   60 / minute Pulse rhythm:   regular BP sitting:   122 / 72  (left arm) Cuff size:   large  Vitals Entered By: Sydell Axon LPN (Jul 23, 9145 2:42 PM) CC: Discuss alternative for Lipitor   History of Present Illness: Pt here as Lipitor requires Precert and has been refused. Pt has never been on alternative med. After discussing, pt willing to try generic.  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 One Tab By Mouth At Night 2)  Lipitor 20 Mg Tabs (Atorvastatin Calcium) .... Take 1 Tablet By Mouth At Bedtime 3)  Proscar 5 Mg Tabs (Finasteride) .... Take 1 Tablet By Mouth Once A Day 4)  Colchicine 0.6 Mg Tabs (Colchicine) .Marland Kitchen.. 1 Tab By Mouthtwice A Day As Needed For Severe Gout Pain 5)  Hydrochlorothiazide 12.5 Mg Caps (Hydrochlorothiazide) .... One Tab By Mouth in Am 6)  Cipro 500 Mg Tabs (Ciprofloxacin Hcl) .... One Tab By Mouth Two Times A Day  Allergies: No Known Drug Allergies  Physical Exam  General:  Well-developed,well-nourished,in no acute distress; alert,appropriate and cooperative throughout examination Head:  Normocephalic and atraumatic without obvious abnormalities. No apparent alopecia or balding. Thinning of hair midline. Eyes:  Conjunctiva clear bilaterally.  Ears:  External ear exam shows no significant lesions or deformities.   Otoscopic examination reveals clear canals, tympanic membranes are intact bilaterally without bulging, retraction, inflammation or discharge. Hearing is grossly normal bilaterally. Nose:  External nasal examination shows no deformity or inflammation. Nasal mucosa are pink and moist without lesions or exudates. Mouth:  Oral mucosa and oropharynx without lesions or exudates.  Teeth in good repair.   Impression & Recommendations:  Problem # 1:  HYPERLIPIDEMIA (ICD-272.4) Assessment Unchanged Will try substituting Simvastatin 40 for Lipitor 20. Get chol prof now for baseline, start medication and then SGOT, SGPT in 6 weeks. Get Chol profile and repeatt SGOT, SGPT for PE scheduled with Dr Alphonsus Sias in Jul. His updated medication list for this problem includes:    Lipitor 20 Mg Tabs (Atorvastatin calcium) .Marland Kitchen... Take 1 tablet by mouth at bedtime    Simvastatin 40 Mg Tabs (Simvastatin) ..... One tab by mouth at night  Complete Medication List: 1)  Metoprolol Succinate 100 Mg Xr24h-tab (Metoprolol succinate) .... One tab by mouth at night 2)  Lipitor 20 Mg Tabs (Atorvastatin calcium) .... Take 1 tablet by mouth at bedtime 3)  Colchicine 0.6 Mg Tabs (Colchicine) .Marland Kitchen.. 1 tab by mouthtwice a day as needed for severe gout pain 4)  Hydrochlorothiazide 12.5 Mg Caps (Hydrochlorothiazide) .... One tab by mouth in am 5)  Simvastatin 40 Mg Tabs (Simvastatin) .... One tab by mouth at night Prescriptions: SIMVASTATIN 40 MG TABS (SIMVASTATIN) one tab by mouth at night  #90 x 3   Entered and Authorized by:   Molly Maduro  Liana Crocker MD   Signed by:   Shaune Leeks MD on 07/23/2009   Method used:   Print then Give to Patient   RxID:   [id number]   Current Allergies (reviewed today): No known allergies

## 2010-04-21 NOTE — Assessment & Plan Note (Signed)
Summary: 6 MONTH FOLLOW UP CHECK BP/RBH   Vital Signs:  Patient Profile:   65 Years Old Male Height:     69 inches (175.26 cm) Weight:      228 pounds Temp:     97.9 degrees F oral Pulse rate:   76 / minute Pulse rhythm:   regular BP sitting:   144 / 84  (left arm) Cuff size:   large  Vitals Entered By: Providence Crosby (May 09, 2008 12:20 PM)                 Chief Complaint:  BP ELEVATED STATED DAUGHTER TOOK IT THIS AM IT WAS 160/85 NURSE AT [facility].  History of Present Illness: Pt here for BP elevation lately. He saw Ortho for shoulder pain and was put on Meloxicam which has helped. He doesn't take it regularly and last took it approx one week ago. He otherwise has not done anything different. He has not had any cold or sinus remedies. His tinnitus has intensified since takijng Meloxicam.    Prior Medications Reviewed Using: Patient Recall  Current Allergies: No known allergies       Physical Exam  General:     Well-developed,well-nourished,in no acute distress; alert,appropriate and cooperative throughout examination Head:     Normocephalic and atraumatic without obvious abnormalities. No apparent alopecia or balding. Eyes:     Conjunctiva clear bilaterally.  Ears:     External ear exam shows no significant lesions or deformities.  Otoscopic examination reveals clear canals, tympanic membranes are intact bilaterally without bulging, retraction, inflammation or discharge. Hearing is grossly normal bilaterally. Nose:     External nasal examination shows no deformity or inflammation. Nasal mucosa are pink and moist without lesions or exudates. Mouth:     Oral mucosa and oropharynx without lesions or exudates.  Teeth in good repair. Neck:     No deformities, masses, or tenderness noted. Chest Wall:     No deformities, masses, tenderness or gynecomastia noted. Lungs:     Normal respiratory effort, chest expands symmetrically. Lungs are clear to auscultation, no  crackles or wheezes. Heart:     Normal rate and regular rhythm. S1 and S2 normal without gallop, murmur, click, rub or other extra sounds.    Impression & Recommendations:  Problem # 1:  HYPERTENSION (ICD-401.9) Assessment: Deteriorated Discussed avoiding sudafed and minimal Meloxicam. Change metopr/Hct to hctz 12.5 in AM and 24hr Metopr 100mg  at night. His updated medication list for this problem includes:    Metoprolol Succinate 100 Mg Xr24h-tab (Metoprolol succinate) ..... One tab by mouth at night    Hydrochlorothiazide 12.5 Mg Caps (Hydrochlorothiazide) ..... One tab by mouth in am   Complete Medication List: 1)  Metoprolol Succinate 100 Mg Xr24h-tab (Metoprolol succinate) .... One tab by mouth at night 2)  Lipitor 20 Mg Tabs (Atorvastatin calcium) .... Take 1 tablet by mouth at bedtime 3)  Proscar 5 Mg Tabs (Finasteride) .... Take 1 tablet by mouth once a day 4)  Colchicine 0.6 Mg Tabs (Colchicine) .Marland Kitchen.. 1 tab po twice a day as needed for severe gout pain 5)  Mobic 7.5 Mg Tabs (Meloxicam) .... Daily as needed dr. Hyacinth Meeker 6)  Hydrochlorothiazide 12.5 Mg Caps (Hydrochlorothiazide) .... One tab by mouth in am   Patient Instructions: 1)  RTC 5/10 for BP check.   Prescriptions: HYDROCHLOROTHIAZIDE 12.5 MG CAPS (HYDROCHLOROTHIAZIDE) one tab by mouth in AM  #90 x 4   Entered and Authorized by:   Shaune Leeks  MD   Signed by:   Shaune Leeks MD on 05/09/2008   Method used:   Print then Give to Patient   RxID:   [id number] METOPROLOL SUCCINATE 100 MG XR24H-TAB (METOPROLOL SUCCINATE) one tab by mouth at night  #90 x 4   Entered and Authorized by:   Shaune Leeks MD   Signed by:   Shaune Leeks MD on 05/09/2008   Method used:   Print then Give to Patient   RxID:   [id number]

## 2010-04-21 NOTE — Assessment & Plan Note (Signed)
Summary: FOOT PAIN/CLE  Medications Added METOPROLOL-HYDROCHLOROTHIAZIDE 50-25 MG TABS (METOPROLOL-HYDROCHLOROTHIAZIDE) Take 1/2 tablet by mouth at bedtime LIPITOR 20 MG TABS (ATORVASTATIN CALCIUM) Take 1 tablet by mouth at bedtime PROSCAR 5 MG TABS (FINASTERIDE) Take 1 tablet by mouth once a day COLCHICINE 0.6 MG TABS (COLCHICINE) 1 three times a day as needed for severe gout pain        Vital Signs:  Patient Profile:   65 Years Old Male Weight:      223.50 pounds Temp:     98.2 degrees F oral Pulse rate:   64 / minute BP sitting:   132 / 70  (left arm) Cuff size:   large  Vitals Entered By: Wandra Mannan (Jul 28, 2006 10:18 AM)               Chief Complaint:  L foot pain.  History of Present Illness: Left foot has hurt for about 3 weeks. Saw [facility] 2 weeks ago--Rx indomethacin for apparent gout Indocin helps but hasn't gotten rid of it. On past attacks, it has resolved easily with just ipubrofen or tylenol Tried ibuprofen last night for 1st time in this episode and it really does seem better today May have had fever last night--got slight chill Pain is worse at night--able to walk on it during day.  Dorsiflexion of toe does cause increased pain At first, he couldn't even stand having a blanket lay on it    Past Medical History:    Gout    Hyperlipidemia    Hypertension    Benign prostatic hypertrophy      Physical Exam  Msk:     mild erythema at left 1st MTP joint. Only slight tenderness now. No pain or redness more distally or in ankles or knees Pulses:     1+ in left foot    Impression & Recommendations:  Problem # 1:  GOUT (ICD-274.9) Assessment: Improved his presentation is classic for gout.  Up until this time he has had fairly mild course, and ibuprofen has generally eliminated the pain fairly quickly.  He's had 3 weeks of symptoms, which were mildly helped by the Indocin, but I persisted until today.  He did try ibuprofen it last night which  did seem to do a better job as his symptoms are less today.  We discussed medications but he doesn't seem to have any that would exacerbate gout.  I did discuss that which foods can be a cause of that and he should be attentive to what he's eaten prior to attacks in the future, as those things should probably be avoided. Plan: To use the ibuprofen on an ongoing basis when he gets symptoms.  I will prescribe colchicine to be used up to t.i.d. if he continues to have pain despite the ibuprofen.  I am going to check a uric acid level. Orders: TLB-Uric Acid, Blood ([id number])  His updated medication list for this problem includes:    Colchicine 0.6 Mg Tabs (Colchicine) .Marland Kitchen... 1 three times a day as needed for severe gout pain   Medications Added to Medication List This Visit: 1)  Metoprolol-hydrochlorothiazide 50-25 Mg Tabs (Metoprolol-hydrochlorothiazide) .... Take 1/2 tablet by mouth at bedtime 2)  Lipitor 20 Mg Tabs (Atorvastatin calcium) .... Take 1 tablet by mouth at bedtime 3)  Proscar 5 Mg Tabs (Finasteride) .... Take 1 tablet by mouth once a day 4)  Colchicine 0.6 Mg Tabs (Colchicine) .Marland Kitchen.. 1 three times a day as needed for severe gout pain  Patient Instructions: 1)  Please schedule a follow-up appointment as needed.     Vital Signs:  Patient Profile:   65 Years Old Male Weight:      223.50 pounds Temp:     98.2 degrees F oral Pulse rate:   64 / minute BP sitting:   132 / 70 Cuff size:   large                Appended Document: FOOT PAIN/CLE I just noted that he is on HCTZ in his metoprolol tab.  If he has recurrent gout, this will need to be discontinued.  Appended Document: Orders Update    Clinical Lists Changes  Orders: Added new Test order of T-Uric Acid [id number]) - Signed

## 2010-04-21 NOTE — Assessment & Plan Note (Signed)
Summary: CPX/DLO   Vital Signs:  Patient profile:   65 year old male Height:      68.5 inches Weight:      227.75 pounds BMI:     34.25 Temp:     97.8 degrees F oral Pulse rate:   60 / minute Pulse rhythm:   regular BP sitting:   128 / 78  (left arm) Cuff size:   large  Vitals Entered By: Sydell Axon LPN (January 14, 2010 9:18 AM) CC: 30 Minute checkup   History of Present Illness: Pt here for Comp Exam, feels well with npo complaints. He has shoulder pain but staqble over last two yeaqrs.  He switched chol meds from Lipitor to Simva 40.  He has no muscle problems.  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 One Tab By Mouth At Night 2)  Lipitor 20 Mg Tabs (Atorvastatin Calcium) .... Take 1 Tablet By Mouth At Bedtime 3)  Colchicine 0.6 Mg Tabs (Colchicine) .Marland Kitchen.. 1 Tab By Mouthtwice A Day As Needed For Severe Gout Pain 4)  Hydrochlorothiazide 12.5 Mg Caps (Hydrochlorothiazide) .... One Tab By Mouth in Am 5)  Simvastatin 40 Mg Tabs (Simvastatin) .... One Tab By Mouth At Night  Allergies: No Known Drug Allergies  Past History:  Past Medical History: Last updated: 11/30/2007 Gout Hyperlipidemia Hypertension Benign prostatic hypertrophy  Family  History: Last updated: 01/14/2010 Father A 84  Emphysema (Smoker) Mother A 36  Breast Ca s/p Mastect 78s Brother A 70 Arthritis  Social History: Last updated: 11/23/2006 Occupation: Vinyl siding install and window replacement, partime Married Lives with wife   3 Children  Risk Factors: Alcohol Use: <1 (01/14/2010) Caffeine Use: <1 (01/14/2010) Exercise: no (01/14/2010)  Risk Factors: Smoking Status: never (01/14/2010) Passive Smoke Exposure: no (01/14/2010)  Past Surgical History: Hernia repair L 1974 Prostate bx x 6 neg 03/09/2002 Catarract R 12/2004 Catarract L 02/2005 Prostatectomy (Dr Artis Flock) 05/25/2009  Family History: Father A 35  Emphysema (Smoker) Mother A 20  Breast Ca s/p Mastect 68s Brother A 64 Arthritis  Social History: Caffeine use/day:  <1  Review of Systems General:  Denies chills, fatigue, fever, sweats, weakness, and weight loss. Eyes:  Complains of blurring; denies discharge and eye pain. ENT:  Complains of ringing in ears; denies decreased hearing, ear discharge, and earache; chronic. CV:  Denies chest pain or discomfort, fainting, fatigue, palpitations, shortness of breath with exertion, swelling of feet, and swelling of hands. Resp:  Denies cough, shortness of breath, and wheezing. GI:  Denies abdominal pain, bloody stools, change in bowel habits, constipation, dark tarry stools, diarrhea, indigestion, loss of appetite, nausea, vomiting, vomiting blood, and yellowish skin color. GU:  Denies discharge, incontinence, nocturia, and urinary frequency. MS:  Denies  joint pain, low back pain, muscle aches, cramps, muscle weakness, and stiffness. Derm:  Complains of dryness; denies itching and rash; chronic upper face. Neuro:  Denies numbness, poor balance, tingling, and tremors.  Physical Exam  General:  Well-developed,well-nourished,in no acute distress; alert,appropriate and cooperative throughout examination Head:  Normocephalic and atraumatic  without obvious abnormalities. No apparent alopecia or balding. Thinning of hair midline. Eyes:  Conjunctiva clear bilaterally.  Ears:  External ear exam shows no significant lesions or deformities.  Otoscopic examination reveals clear canals, tympanic membranes are intact bilaterally without bulging, retraction, inflammation or discharge. Hearing is grossly normal bilaterally. Nose:  External nasal examination shows no deformity or inflammation. Nasal mucosa are pink and moist without lesions or exudates. Mouth:  Oral mucosa and oropharynx without lesions or exudates.  Teeth in good repair. Neck:  No deformities, masses, or tenderness noted. Chest Wall:  No deformities, masses, tenderness or gynecomastia noted. Breasts:  No masses or gynecomastia noted Lungs:  Normal respiratory effort, chest expands symmetrically. Lungs are clear to auscultation, no crackles or wheezes. Heart:  Normal rate and regular rhythm. S1 and S2 normal without gallop, murmur, click, rub or other extra sounds. Abdomen:  Bowel sounds positive,abdomen soft and non-tender without masses, organomegaly or hernias noted. Slightly protuberant. Rectal:  No external abnormalities noted. Normal sphincter tone. No rectal masses or tenderness. G neg. Genitalia:  Testes bilaterally descended without nodularity, tenderness or masses. No scrotal masses or lesions. No penis lesions or urethral discharge. Prostate:  Exam c/w prostatectomy this year. Msk:  No deformity or scoliosis noted of thoracic or lumbar spine.   Left shoulder tender over the A/C joint area with mild discomfort on ROM of the upper deltoid fiber area. Pulses:  R and L carotid,radial,femoral,dorsalis pedis and posterior tibial pulses are full and equal bilaterally Extremities:  No clubbing, cyanosis, edema, or deformity noted with normal full range of motion of all joints except mild swelling, mild decreased ROM and significant tenderness to direct palpation of the middle  finger MCP joint. Neurologic:  No cranial nerve deficits noted. Station and gait are normal. Sensory, motor and coordinative functions appear intact. Skin:  Intact without suspicious lesions or rashes Cervical Nodes:  No lymphadenopathy noted Inguinal Nodes:  No significant adenopathy Psych:  Cognition and judgment appear intact. Alert and cooperative with normal attention span and concentration. No apparent delusions, illusions, hallucinations   Impression & Recommendations:  Problem # 1:  HEALTH MAINTENANCE EXAM (ICD-V70.0)  Reviewed preventive care protocols, scheduled due services, and updated immunizations.  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  Problem # 3:  SHOULDER PAIN, LEFT (ICD-719.41) Assessment: Unchanged  Continues, not ready for eval.  Discussed shoulder exercises, use of moist heat or ice, and medication.   Problem # 4:  HYPERTENSION (ICD-401.9) Assessment: Unchanged Adequate. His updated medication list for this problem includes:    Metoprolol Succinate 100 Mg Xr24h-tab (Metoprolol succinate) ..... One tab by mouth at night    Hydrochlorothiazide 12.5 Mg Caps (Hydrochlorothiazide) ..... One tab by mouth in am  BP today: 128/78 Prior BP: 122/72 (07/23/2009)  Problem # 5:  HYPERLIPIDEMIA (ICD-272.4) Assessment: Unchanged Adequate control altho not as good as with Lipitor. Would continue. Discussed lifestyle changes that will help. The following medications were removed from the medication list:    Lipitor 20 Mg Tabs (Atorvastatin calcium) .Marland Kitchen... Take 1 tablet by mouth at bedtime His updated medication list for this problem includes:  Simvastatin 40 Mg Tabs (Simvastatin) ..... One tab by mouth at night  Problem # 6:  GOUT (ICD-274.9) Uric Acid mid 7s...Marland Kitchenat risk but not worth suppressive therapy. His updated medication list for this problem includes:    Colchicine 0.6 Mg Tabs  (Colchicine) .Marland Kitchen... 1 tab by mouthtwice a day as needed for severe gout pain  Complete Medication List: 1)  Metoprolol Succinate 100 Mg Xr24h-tab (Metoprolol succinate) .... One tab by mouth at night 2)  Colchicine 0.6 Mg Tabs (Colchicine) .Marland Kitchen.. 1 tab by mouthtwice a day as needed for severe gout pain 3)  Hydrochlorothiazide 12.5 Mg Caps (Hydrochlorothiazide) .... One tab by mouth in am 4)  Simvastatin 40 Mg Tabs (Simvastatin) .... One tab by mouth at night 5)  Zantac 75 75 Mg Tabs (Ranitidine hcl) .... As needed Prescriptions: SIMVASTATIN 40 MG TABS (SIMVASTATIN) one tab by mouth at night  #90 x 3   Entered and Authorized by:   Shaune Leeks MD   Signed by:   Shaune Leeks MD on 01/14/2010   Method used:   Print then Give to Patient   RxID:   [id number] HYDROCHLOROTHIAZIDE 12.5 MG CAPS (HYDROCHLOROTHIAZIDE) one tab by mouth in AM  #90 x 3   Entered and Authorized by:   Shaune Leeks MD   Signed by:   Shaune Leeks MD on 01/14/2010   Method used:   Print then Give to Patient   RxID:   [id number] METOPROLOL SUCCINATE 100 MG XR24H-TAB (METOPROLOL SUCCINATE) one tab by mouth at night  #90 x 3   Entered and Authorized by:   Shaune Leeks MD   Signed by:   Shaune Leeks MD on 01/14/2010   Method used:   Print then Give to Patient   RxID:   [id number]    Orders Added: 1)  Est. Patient 40-64 years [25852]    Current Allergies (reviewed today): No known allergies   Appended Document: CPX/DLO Flu Vaccine Consent Questions     Do you have a history of severe allergic reactions to this vaccine? no    Any prior history of allergic reactions to egg and/or gelatin? no    Do you have a sensitivity to the preservative Thimersol? no    Do you have a past history of Guillan-Barre Syndrome? no    Do you currently have an acute febrile illness? no    Have you ever had a severe reaction to latex? no    Vaccine information given and  explained to patient? yes    Are you currently pregnant? no    Lot Number:[id number]   Exp Date:09/19/2010   Site Given  Left Deltoid IM

## 2010-04-21 NOTE — Progress Notes (Signed)
Summary: [facility]/EKG needed  Phone Note From Other Clinic Call back at [phone]   Caller: Diane/ [facility] Call For: Dr. Hetty Ely Summary of Call: Last EKG. Fax [phone]. Needs copy of last EKG faxed to them because patient is getting ready to have surgery and they need to compare the EKGs Initial call taken by: Sydell Axon LPN,  May 21, 2009 10:19 AM  Follow-up for Phone Call        [facility] back sp w/ Diane, last ekg was 1999--told her we did not have a recent ekg.Marland KitchenDaine Gip  May 21, 2009 10:22 AM   Follow-up by: Daine Gip,  May 21, 2009 10:22 AM     Appended Document: [facility]/EKG needed Faxed ekg, inform [facility], we did not have any recent EKG...cdavis 05-21-2009

## 2010-04-21 NOTE — Progress Notes (Signed)
Summary: RX Proscar  Phone Note Refill Request Call back at [phone] Message from:  [facility] on December 17, 2008 2:38 PM  Refills Requested: Medication #1:  PROSCAR 5 MG TABS Take 1 tablet by mouth once a day Received form from pharmacy to be signed and faxed back.  IN YOUR IN BOX   Method Requested: Fax to Mail Away Pharmacy Initial call taken by: Sydell Axon LPN,  December 17, 2008 2:39 PM  Follow-up for Phone Call        Rx faxed to pharmacy Follow-up by: Sydell Axon LPN,  December 17, 2008 3:15 PM    Prescriptions: PROSCAR 5 MG TABS (FINASTERIDE) Take 1 tablet by mouth once a day  #90 x 3   Entered and Authorized by:   Shaune Leeks MD   Signed by:   Shaune Leeks MD on 12/17/2008   Method used:   Printed then faxed to ...       [facility] Delivery Fax) (mail-order)             ,          Ph: [phone]       Fax: [phone]   RxID:   [id number]

## 2010-07-13 ENCOUNTER — Telehealth: Payer: Self-pay | Admitting: *Deleted

## 2010-07-13 MED ORDER — COLCHICINE 0.6 MG PO TABS: 0.6000 mg | ORAL_TABLET | Freq: Every day | ORAL | Status: DC

## 2010-07-13 NOTE — Telephone Encounter (Signed)
Left message on ans machine to return call

## 2010-07-13 NOTE — Telephone Encounter (Signed)
rx sent.  Please have pt fu if pain isn't improving.  Thanks.

## 2010-07-13 NOTE — Telephone Encounter (Signed)
Patient advised.

## 2010-07-13 NOTE — Telephone Encounter (Signed)
Pt is having a gout flare up in his left great toe and is asking if colcrys can be called to [facility].  He had colchicine in the past.

## 2010-10-02 ENCOUNTER — Other Ambulatory Visit: Payer: Self-pay | Admitting: Family Medicine

## 2010-11-03 ENCOUNTER — Other Ambulatory Visit: Payer: Self-pay | Admitting: *Deleted

## 2010-11-03 NOTE — Telephone Encounter (Signed)
Received faxed form from [facility] requesting new prescription. Form is in your in box to complete and fax back.

## 2010-11-04 MED ORDER — SIMVASTATIN 40 MG PO TABS: 40.0000 mg | ORAL_TABLET | Freq: Every day | ORAL | Status: DC

## 2010-11-04 NOTE — Telephone Encounter (Signed)
Completed form faxed to pharmacy.

## 2010-11-17 ENCOUNTER — Other Ambulatory Visit: Payer: Self-pay | Admitting: Family Medicine

## 2011-01-05 ENCOUNTER — Other Ambulatory Visit (INDEPENDENT_AMBULATORY_CARE_PROVIDER_SITE_OTHER): Payer: 59

## 2011-01-05 DIAGNOSIS — Z125 Encounter for screening for malignant neoplasm of prostate: Secondary | ICD-10-CM

## 2011-01-05 DIAGNOSIS — E7800 Pure hypercholesterolemia, unspecified: Secondary | ICD-10-CM

## 2011-01-05 DIAGNOSIS — I10 Essential (primary) hypertension: Secondary | ICD-10-CM

## 2011-01-13 ENCOUNTER — Encounter: Payer: Self-pay | Admitting: Family Medicine

## 2011-01-20 ENCOUNTER — Encounter: Payer: Self-pay | Admitting: Family Medicine

## 2011-01-20 ENCOUNTER — Ambulatory Visit (INDEPENDENT_AMBULATORY_CARE_PROVIDER_SITE_OTHER): Payer: 59 | Admitting: Family Medicine

## 2011-01-20 VITALS — BP 122/72 | HR 68 | Temp 98.7°F | Ht 68.5 in | Wt 232.2 lb

## 2011-01-20 DIAGNOSIS — E785 Hyperlipidemia, unspecified: Secondary | ICD-10-CM

## 2011-01-20 DIAGNOSIS — N400 Enlarged prostate without lower urinary tract symptoms: Secondary | ICD-10-CM

## 2011-01-20 DIAGNOSIS — M109 Gout, unspecified: Secondary | ICD-10-CM

## 2011-01-20 DIAGNOSIS — M25519 Pain in unspecified shoulder: Secondary | ICD-10-CM

## 2011-01-20 DIAGNOSIS — Z23 Encounter for immunization: Secondary | ICD-10-CM

## 2011-01-20 DIAGNOSIS — I10 Essential (primary) hypertension: Secondary | ICD-10-CM

## 2011-01-20 DIAGNOSIS — Z1211 Encounter for screening for malignant neoplasm of colon: Secondary | ICD-10-CM

## 2011-01-20 DIAGNOSIS — C61 Malignant neoplasm of prostate: Secondary | ICD-10-CM

## 2011-01-20 MED ORDER — METRONIDAZOLE 1 % EX GEL: Freq: Every day | CUTANEOUS | Status: AC

## 2011-01-20 NOTE — Progress Notes (Signed)
  Subjective:    Patient ID: Tristan Booker, male    DOB: 12-25-1945, 65 y.o.   MRN: [id number]  HPI Pt here for Comp Exam. He has not been seen in the last year. He has not had a colonoscopy but has BRBPR at times from known hemms. He is not adverse to having a colonoscopy.  He has some reflux at times and uses Zantac.  He saw Dr Artis Flock last week and started Cialis and has noted GERD since starting the Cialis.    Review of Systems  Constitutional: Positive for fatigue (has occas.esp since prostatectomy). Negative for fever, chills, diaphoresis, appetite change and unexpected weight change.  HENT: Positive for tinnitus (chronic for 15 years.). Negative for hearing loss, ear pain and ear discharge.   Eyes: Positive for visual disturbance (mild vision change lately.). Negative for pain, discharge and redness.  Respiratory: Negative for cough, shortness of breath and wheezing.   Cardiovascular: Negative for chest pain and palpitations.       No SOB w/ exertion  Gastrointestinal: Positive for abdominal pain. Negative for nausea, vomiting, diarrhea, constipation and blood in stool.       No heartburn or swallowing problems. See HPI.  Genitourinary: Negative for dysuria, frequency and difficulty urinating.       No nocturia  Musculoskeletal: Negative for myalgias, back pain and arthralgias.  Skin: Negative for rash.       No itching but dryness around the eyes..  Neurological: Negative for tremors and numbness.       No tingling or balance problems.  Hematological: Negative for adenopathy. Does not bruise/bleed easily.  Psychiatric/Behavioral: Negative for dysphoric mood and agitation.       Objective:   Physical Exam        Assessment & Plan:

## 2011-01-20 NOTE — Assessment & Plan Note (Signed)
Rare occurences with high uric acid. Would not treat daily as of now.

## 2011-01-20 NOTE — Assessment & Plan Note (Signed)
S/p prostatectomy for prostate ca. Cont with Dr Artis Flock.

## 2011-01-20 NOTE — Assessment & Plan Note (Signed)
Continues. Still not ready for eval.

## 2011-01-20 NOTE — Assessment & Plan Note (Signed)
LDL ok, Trigs slightly high. Discussed diet and exercise to improve. Weight loss will help. Labs per [facility].

## 2011-01-20 NOTE — Patient Instructions (Addendum)
Refer to Dr Mechele Collin for colonoscopy.  RTC 2/13 with Dr Para March for establish and followup of Rosacea.  Flu shot today. Look into Zostavax. Start Metrogel when script arrives.

## 2011-01-20 NOTE — Assessment & Plan Note (Signed)
Well controlled. Cont curr med. BP Readings from Last 3 Encounters:  01/20/11 122/72  01/14/10 128/78  07/23/09 122/72

## 2011-03-09 ENCOUNTER — Ambulatory Visit: Payer: Self-pay | Admitting: Unknown Physician Specialty

## 2011-03-10 LAB — PATHOLOGY REPORT

## 2011-03-17 ENCOUNTER — Encounter: Payer: Self-pay | Admitting: Family Medicine

## 2011-03-17 DIAGNOSIS — K5730 Diverticulosis of large intestine without perforation or abscess without bleeding: Secondary | ICD-10-CM | POA: Insufficient documentation

## 2011-03-17 DIAGNOSIS — D369 Benign neoplasm, unspecified site: Secondary | ICD-10-CM | POA: Insufficient documentation

## 2011-03-19 ENCOUNTER — Encounter: Payer: Self-pay | Admitting: Family Medicine

## 2011-04-27 ENCOUNTER — Other Ambulatory Visit: Payer: Self-pay | Admitting: *Deleted

## 2011-04-27 MED ORDER — METOPROLOL SUCCINATE ER 100 MG PO TB24: 100.0000 mg | ORAL_TABLET | Freq: Every day | ORAL | Status: DC

## 2011-05-10 ENCOUNTER — Ambulatory Visit: Payer: 59 | Admitting: Family Medicine

## 2011-12-15 ENCOUNTER — Other Ambulatory Visit: Payer: Self-pay | Admitting: Family Medicine

## 2011-12-15 DIAGNOSIS — M109 Gout, unspecified: Secondary | ICD-10-CM

## 2011-12-15 DIAGNOSIS — I10 Essential (primary) hypertension: Secondary | ICD-10-CM

## 2011-12-15 DIAGNOSIS — C61 Malignant neoplasm of prostate: Secondary | ICD-10-CM

## 2011-12-21 ENCOUNTER — Other Ambulatory Visit (INDEPENDENT_AMBULATORY_CARE_PROVIDER_SITE_OTHER): Payer: 59

## 2011-12-21 ENCOUNTER — Other Ambulatory Visit: Payer: Self-pay | Admitting: Family Medicine

## 2011-12-21 DIAGNOSIS — R739 Hyperglycemia, unspecified: Secondary | ICD-10-CM

## 2011-12-21 DIAGNOSIS — M109 Gout, unspecified: Secondary | ICD-10-CM

## 2011-12-21 DIAGNOSIS — I10 Essential (primary) hypertension: Secondary | ICD-10-CM

## 2011-12-21 DIAGNOSIS — C61 Malignant neoplasm of prostate: Secondary | ICD-10-CM

## 2011-12-22 LAB — URIC ACID: Uric Acid: 9.4 mg/dL — ABNORMAL HIGH (ref 3.7–8.6)

## 2011-12-22 LAB — LIPID PANEL WITH LDL/HDL RATIO
Cholesterol, Total: 194 mg/dL (ref 100–199)
HDL: 51 mg/dL (ref >39)
LDL Calculated: 118 mg/dL — ABNORMAL HIGH (ref 0–99)
LDl/HDL Ratio: 2.3 ratio units (ref 0.0–3.6)
Triglycerides: 127 mg/dL (ref 0–149)
VLDL Cholesterol Cal: 25 mg/dL (ref 5–40)

## 2011-12-22 LAB — COMPREHENSIVE METABOLIC PANEL
ALT: 26 IU/L (ref 0–44)
AST: 23 IU/L (ref 0–40)
Albumin/Globulin Ratio: 2 (ref 1.1–2.5)
Albumin: 4.3 g/dL (ref 3.6–4.8)
Alkaline Phosphatase: 104 IU/L — ABNORMAL HIGH (ref 44–103)
BUN/Creatinine Ratio: 13 (ref 10–22)
BUN: 17 mg/dL (ref 8–27)
CO2: 21 mmol/L (ref 19–28)
Calcium: 9.5 mg/dL (ref 8.6–10.2)
Chloride: 101 mmol/L (ref 97–108)
Creatinine, Ser: 1.26 mg/dL (ref 0.76–1.27)
GFR calc Af Amer: 69 mL/min/{1.73_m2} (ref >59)
GFR calc non Af Amer: 60 mL/min/{1.73_m2} (ref >59)
Globulin, Total: 2.2 g/dL (ref 1.5–4.5)
Glucose: 98 mg/dL (ref 65–99)
Potassium: 4.3 mmol/L (ref 3.5–5.2)
Sodium: 140 mmol/L (ref 134–144)
Total Bilirubin: 0.4 mg/dL (ref 0.0–1.2)
Total Protein: 6.5 g/dL (ref 6.0–8.5)

## 2011-12-22 LAB — PSA: PSA: <0.1 ng/mL (ref 0.0–4.0)

## 2011-12-22 LAB — HEMOGLOBIN A1C
Est. average glucose Bld gHb Est-mCnc: 120 mg/dL
Hgb A1c MFr Bld: 5.8 % — ABNORMAL HIGH (ref 4.8–5.6)

## 2011-12-28 ENCOUNTER — Ambulatory Visit (INDEPENDENT_AMBULATORY_CARE_PROVIDER_SITE_OTHER): Payer: 59 | Admitting: Family Medicine

## 2011-12-28 ENCOUNTER — Encounter: Payer: Self-pay | Admitting: Family Medicine

## 2011-12-28 VITALS — BP 144/78 | HR 60 | Temp 98.2°F | Ht 69.0 in | Wt 227.8 lb

## 2011-12-28 DIAGNOSIS — I10 Essential (primary) hypertension: Secondary | ICD-10-CM

## 2011-12-28 DIAGNOSIS — Z0000 Encounter for general adult medical examination without abnormal findings: Secondary | ICD-10-CM

## 2011-12-28 DIAGNOSIS — Z23 Encounter for immunization: Secondary | ICD-10-CM

## 2011-12-28 DIAGNOSIS — M109 Gout, unspecified: Secondary | ICD-10-CM

## 2011-12-28 DIAGNOSIS — E785 Hyperlipidemia, unspecified: Secondary | ICD-10-CM

## 2011-12-28 DIAGNOSIS — C61 Malignant neoplasm of prostate: Secondary | ICD-10-CM

## 2011-12-28 MED ORDER — HYDROCHLOROTHIAZIDE 12.5 MG PO CAPS: ORAL_CAPSULE | ORAL | Status: DC

## 2011-12-28 MED ORDER — METOPROLOL SUCCINATE ER 100 MG PO TB24: 100.0000 mg | ORAL_TABLET | Freq: Every day | ORAL | Status: DC

## 2011-12-28 MED ORDER — SIMVASTATIN 40 MG PO TABS: 40.0000 mg | ORAL_TABLET | Freq: Every day | ORAL | Status: DC

## 2011-12-28 NOTE — Progress Notes (Signed)
CPE- See plan.  Routine anticipatory guidance given to patient.  See health maintenance. Colonoscopy 2012 PSA low as expected 2013 Tetanus 2009 Flu shot 2013 Shingles shot encouraged, he'll check on coverage.  PNA at 40 Doesn't have a living will; encouraged to talk with wife.    Hypertension:    Using medication without problems or lightheadedness: yes Chest pain with exertion:no Edema:no Short of breath:no BP checked at home, variable, with some as low as 120/80s.    Gout.  Gave pt a gout diet.  On HCTZ but no recent flares.  No ADE from prn colchicine.    Elevated Cholesterol: Using medications without problems:yes Muscle aches: no Diet compliance: "I eat what I want." Exercise: "chasing grandbabies around".  Encouraged more exercise.    He's caring for both parents.    PMH and SH reviewed  Meds, vitals, and allergies reviewed.   ROS: See HPI.  Otherwise negative.    GEN: nad, alert and oriented HEENT: mucous membranes moist NECK: supple w/o LA CV: rrr. PULM: ctab, no inc wob ABD: soft, +bs EXT: no edema SKIN: no acute rash

## 2011-12-28 NOTE — Patient Instructions (Addendum)
Check with your insurance to see if they will cover the shingles shot. I would get a flu shot each fall.   Start back walking and try to cut some calories per week (ie a few beers). Take care.  Glad to see you.  Recheck in 1 year at a physical.

## 2011-12-29 DIAGNOSIS — Z0000 Encounter for general adult medical examination without abnormal findings: Secondary | ICD-10-CM | POA: Insufficient documentation

## 2011-12-29 NOTE — Assessment & Plan Note (Signed)
Controlled, Needs to work on weight/diet/exercise.  No change in meds.   

## 2011-12-29 NOTE — Assessment & Plan Note (Signed)
Routine anticipatory guidance given to patient.  See health maintenance. Colonoscopy 2012 PSA low as expected 2013 Tetanus 2009 Flu shot 2013 Shingles shot encouraged, he'll check on coverage.  PNA at 83 Doesn't have a living will; encouraged to talk with wife.

## 2011-12-29 NOTE — Assessment & Plan Note (Signed)
Controlled, Needs to work on weight/diet/exercise.  No change in meds.

## 2011-12-29 NOTE — Assessment & Plan Note (Signed)
PSA low.  Per uro.

## 2011-12-29 NOTE — Assessment & Plan Note (Signed)
Handout given re: diet.  Needs to work on weight/diet/exercise.  No change in meds.

## 2012-08-24 ENCOUNTER — Other Ambulatory Visit: Payer: Self-pay | Admitting: Family Medicine

## 2012-12-17 ENCOUNTER — Other Ambulatory Visit: Payer: Self-pay | Admitting: Family Medicine

## 2012-12-17 DIAGNOSIS — M109 Gout, unspecified: Secondary | ICD-10-CM

## 2012-12-17 DIAGNOSIS — E785 Hyperlipidemia, unspecified: Secondary | ICD-10-CM

## 2012-12-25 ENCOUNTER — Other Ambulatory Visit (INDEPENDENT_AMBULATORY_CARE_PROVIDER_SITE_OTHER): Payer: Medicare Other

## 2012-12-25 DIAGNOSIS — E785 Hyperlipidemia, unspecified: Secondary | ICD-10-CM | POA: Diagnosis not present

## 2012-12-25 DIAGNOSIS — M109 Gout, unspecified: Secondary | ICD-10-CM

## 2012-12-26 LAB — LIPID PANEL
Chol/HDL Ratio: 4.1 ratio units (ref 0.0–5.0)
Cholesterol, Total: 210 mg/dL — ABNORMAL HIGH (ref 100–199)
HDL: 51 mg/dL (ref >39)
LDL Calculated: 131 mg/dL — ABNORMAL HIGH (ref 0–99)
Triglycerides: 141 mg/dL (ref 0–149)
VLDL Cholesterol Cal: 28 mg/dL (ref 5–40)

## 2012-12-26 LAB — COMPREHENSIVE METABOLIC PANEL
ALT: 24 IU/L (ref 0–44)
AST: 26 IU/L (ref 0–40)
Albumin/Globulin Ratio: 1.8 (ref 1.1–2.5)
Albumin: 4.4 g/dL (ref 3.6–4.8)
Alkaline Phosphatase: 106 IU/L (ref 39–117)
BUN/Creatinine Ratio: 15 (ref 10–22)
BUN: 17 mg/dL (ref 8–27)
CO2: 24 mmol/L (ref 18–29)
Calcium: 9.3 mg/dL (ref 8.6–10.2)
Chloride: 101 mmol/L (ref 97–108)
Creatinine, Ser: 1.11 mg/dL (ref 0.76–1.27)
GFR calc Af Amer: 80 mL/min/{1.73_m2} (ref >59)
GFR calc non Af Amer: 69 mL/min/{1.73_m2} (ref >59)
Globulin, Total: 2.5 g/dL (ref 1.5–4.5)
Glucose: 114 mg/dL — ABNORMAL HIGH (ref 65–99)
Potassium: 4.2 mmol/L (ref 3.5–5.2)
Sodium: 143 mmol/L (ref 134–144)
Total Bilirubin: 0.3 mg/dL (ref 0.0–1.2)
Total Protein: 6.9 g/dL (ref 6.0–8.5)

## 2012-12-26 LAB — URIC ACID: Uric Acid: 8.1 mg/dL (ref 3.7–8.6)

## 2013-01-01 ENCOUNTER — Encounter (INDEPENDENT_AMBULATORY_CARE_PROVIDER_SITE_OTHER): Payer: Self-pay

## 2013-01-01 ENCOUNTER — Encounter: Payer: Self-pay | Admitting: Family Medicine

## 2013-01-01 ENCOUNTER — Ambulatory Visit (INDEPENDENT_AMBULATORY_CARE_PROVIDER_SITE_OTHER): Payer: Medicare Other | Admitting: Family Medicine

## 2013-01-01 VITALS — BP 128/70 | HR 60 | Temp 98.2°F | Ht 68.5 in | Wt 224.5 lb

## 2013-01-01 DIAGNOSIS — C61 Malignant neoplasm of prostate: Secondary | ICD-10-CM

## 2013-01-01 DIAGNOSIS — Z0000 Encounter for general adult medical examination without abnormal findings: Secondary | ICD-10-CM | POA: Diagnosis not present

## 2013-01-01 DIAGNOSIS — E785 Hyperlipidemia, unspecified: Secondary | ICD-10-CM | POA: Diagnosis not present

## 2013-01-01 DIAGNOSIS — Z23 Encounter for immunization: Secondary | ICD-10-CM

## 2013-01-01 DIAGNOSIS — Z636 Dependent relative needing care at home: Secondary | ICD-10-CM

## 2013-01-01 DIAGNOSIS — M109 Gout, unspecified: Secondary | ICD-10-CM

## 2013-01-01 DIAGNOSIS — I10 Essential (primary) hypertension: Secondary | ICD-10-CM | POA: Diagnosis not present

## 2013-01-01 MED ORDER — COLCHICINE 0.6 MG PO TABS: 0.6000 mg | ORAL_TABLET | Freq: Every day | ORAL | Status: DC

## 2013-01-01 MED ORDER — HYDROCHLOROTHIAZIDE 12.5 MG PO CAPS: 12.5000 mg | ORAL_CAPSULE | ORAL | Status: DC

## 2013-01-01 MED ORDER — METOPROLOL SUCCINATE ER 100 MG PO TB24: 100.0000 mg | ORAL_TABLET | Freq: Every day | ORAL | Status: DC

## 2013-01-01 MED ORDER — SIMVASTATIN 40 MG PO TABS: 40.0000 mg | ORAL_TABLET | Freq: Every day | ORAL | Status: DC

## 2013-01-01 NOTE — Assessment & Plan Note (Signed)
Diet and exercise limited by family demands.  D/w pt.  He is trying.  Labs discussed.  Continue current meds for now.

## 2013-01-01 NOTE — Patient Instructions (Addendum)
Check with your insurance to see if they will cover the shingles shot. I would get the pneumonia shot within the next year.  If you have to take the colchicine, skip the simvastatin for a few days.

## 2013-01-01 NOTE — Assessment & Plan Note (Signed)
Diet and exercise limited by family demands.  D/w pt.  He is trying.  Labs discussed.  Continue current meds for now.  

## 2013-01-01 NOTE — Progress Notes (Signed)
I have personally reviewed the Medicare Annual Wellness questionnaire and have noted 1. The patient's medical and social history 2. Their use of alcohol, tobacco or illicit drugs 3. Their current medications and supplements 4. The patient's functional ability including ADL's, fall risks, home safety risks and hearing or visual             impairment. 5. Diet and physical activities 6. Evidence for depression or mood disorders  The patients weight, height, BMI have been recorded in the chart and visual acuity is per eye clinic.  I have made referrals, counseling and provided education to the patient based review of the above and I have provided the pt with a written personalized care plan for preventive services.  See scanned forms.  Routine anticipatory guidance given to patient.  See health maintenance. Flu 2014 Shingles d/w pt.  PNA encouraged.   Tetanus 2009 Colonoscopy 2012 Advance directive encouraged.  Wife designated if incapacitated.    Cognitive function addressed- see scanned forms- and if abnormal then additional documentation follows.   Caring for mother and father.  Both debilitated. Father with hospice help.  He's running two households in the meantime.   Hypertension:    Using medication without problems or lightheadedness: yes Chest pain with exertion:no Edema:no Short of breath:no  Elevated Cholesterol: Using medications without problems: yes Muscle aches: no Diet compliance: limited, discussed Exercise: limited, discussed  Gout.  Rare sx.  Compliant with meds.  Rare use of colchicine.   Prostate cancer per uro.    PMH and SH reviewed  Meds, vitals, and allergies reviewed.   ROS: See HPI.  Otherwise negative.    GEN: nad, alert and oriented HEENT: mucous membranes moist NECK: supple w/o LA CV: rrr. PULM: ctab, no inc wob ABD: soft, +bs EXT: no edema SKIN: no acute rash

## 2013-01-01 NOTE — Assessment & Plan Note (Signed)
Caring for mother and father.  Both debilitated. Father with hospice help.  He's running two households in the meantime. I offered my support.

## 2013-01-01 NOTE — Assessment & Plan Note (Signed)
See scanned forms.  Routine anticipatory guidance given to patient.  See health maintenance. Flu 2014 Shingles d/w pt.  PNA encouraged.   Tetanus 2009 Colonoscopy 2012 Advance directive encouraged.  Wife designated if incapacitated.    Cognitive function addressed- see scanned forms- and if abnormal then additional documentation follows.

## 2013-01-01 NOTE — Assessment & Plan Note (Signed)
Per uro.  

## 2013-01-01 NOTE — Assessment & Plan Note (Signed)
Rare sx.  Continue as is.

## 2013-01-24 DIAGNOSIS — N529 Male erectile dysfunction, unspecified: Secondary | ICD-10-CM | POA: Diagnosis not present

## 2013-01-24 DIAGNOSIS — D400 Neoplasm of uncertain behavior of prostate: Secondary | ICD-10-CM | POA: Diagnosis not present

## 2013-01-24 DIAGNOSIS — C61 Malignant neoplasm of prostate: Secondary | ICD-10-CM | POA: Diagnosis not present

## 2013-03-14 ENCOUNTER — Encounter: Payer: Self-pay | Admitting: Family Medicine

## 2013-07-25 DIAGNOSIS — N529 Male erectile dysfunction, unspecified: Secondary | ICD-10-CM | POA: Diagnosis not present

## 2013-07-25 DIAGNOSIS — D400 Neoplasm of uncertain behavior of prostate: Secondary | ICD-10-CM | POA: Diagnosis not present

## 2013-07-25 DIAGNOSIS — C61 Malignant neoplasm of prostate: Secondary | ICD-10-CM | POA: Diagnosis not present

## 2013-12-27 ENCOUNTER — Other Ambulatory Visit: Payer: Self-pay | Admitting: Family Medicine

## 2013-12-27 DIAGNOSIS — E785 Hyperlipidemia, unspecified: Secondary | ICD-10-CM

## 2013-12-27 DIAGNOSIS — M109 Gout, unspecified: Secondary | ICD-10-CM

## 2013-12-31 ENCOUNTER — Other Ambulatory Visit (INDEPENDENT_AMBULATORY_CARE_PROVIDER_SITE_OTHER): Payer: Medicare Other

## 2013-12-31 DIAGNOSIS — M109 Gout, unspecified: Secondary | ICD-10-CM | POA: Diagnosis not present

## 2013-12-31 DIAGNOSIS — I10 Essential (primary) hypertension: Secondary | ICD-10-CM

## 2013-12-31 DIAGNOSIS — E785 Hyperlipidemia, unspecified: Secondary | ICD-10-CM | POA: Diagnosis not present

## 2013-12-31 LAB — LIPID PANEL
Cholesterol: 182 mg/dL (ref 0–200)
HDL: 40.5 mg/dL (ref >39.00)
NonHDL: 141.5
Total CHOL/HDL Ratio: 4
Triglycerides: 203 mg/dL — ABNORMAL HIGH (ref 0.0–149.0)
VLDL: 40.6 mg/dL — ABNORMAL HIGH (ref 0.0–40.0)

## 2013-12-31 LAB — COMPREHENSIVE METABOLIC PANEL
ALT: 21 U/L (ref 0–53)
AST: 22 U/L (ref 0–37)
Albumin: 3.6 g/dL (ref 3.5–5.2)
Alkaline Phosphatase: 91 U/L (ref 39–117)
BUN: 14 mg/dL (ref 6–23)
CO2: 26 mEq/L (ref 19–32)
Calcium: 9.1 mg/dL (ref 8.4–10.5)
Chloride: 103 mEq/L (ref 96–112)
Creatinine, Ser: 1.2 mg/dL (ref 0.4–1.5)
GFR: 61.97 mL/min (ref >60.00)
Glucose, Bld: 105 mg/dL — ABNORMAL HIGH (ref 70–99)
Potassium: 3.9 mEq/L (ref 3.5–5.1)
Sodium: 138 mEq/L (ref 135–145)
Total Bilirubin: 0.7 mg/dL (ref 0.2–1.2)
Total Protein: 7.2 g/dL (ref 6.0–8.3)

## 2013-12-31 LAB — URIC ACID: Uric Acid, Serum: 8.1 mg/dL — ABNORMAL HIGH (ref 4.0–7.8)

## 2013-12-31 LAB — LDL CHOLESTEROL, DIRECT: Direct LDL: 111.6 mg/dL

## 2014-01-03 ENCOUNTER — Encounter: Payer: Self-pay | Admitting: Family Medicine

## 2014-01-03 ENCOUNTER — Ambulatory Visit (INDEPENDENT_AMBULATORY_CARE_PROVIDER_SITE_OTHER): Payer: Medicare Other | Admitting: Family Medicine

## 2014-01-03 VITALS — BP 146/84 | HR 76 | Temp 98.2°F | Ht 68.25 in | Wt 225.5 lb

## 2014-01-03 DIAGNOSIS — Z636 Dependent relative needing care at home: Secondary | ICD-10-CM | POA: Diagnosis not present

## 2014-01-03 DIAGNOSIS — E785 Hyperlipidemia, unspecified: Secondary | ICD-10-CM

## 2014-01-03 DIAGNOSIS — C61 Malignant neoplasm of prostate: Secondary | ICD-10-CM

## 2014-01-03 DIAGNOSIS — M109 Gout, unspecified: Secondary | ICD-10-CM | POA: Diagnosis not present

## 2014-01-03 DIAGNOSIS — Z0000 Encounter for general adult medical examination without abnormal findings: Secondary | ICD-10-CM | POA: Diagnosis not present

## 2014-01-03 DIAGNOSIS — I10 Essential (primary) hypertension: Secondary | ICD-10-CM

## 2014-01-03 MED ORDER — COLCHICINE 0.6 MG PO TABS: 0.6000 mg | ORAL_TABLET | Freq: Every day | ORAL | Status: DC

## 2014-01-03 MED ORDER — HYDROCHLOROTHIAZIDE 12.5 MG PO CAPS: 12.5000 mg | ORAL_CAPSULE | ORAL | Status: DC

## 2014-01-03 MED ORDER — SIMVASTATIN 40 MG PO TABS: 40.0000 mg | ORAL_TABLET | Freq: Every day | ORAL | Status: DC

## 2014-01-03 MED ORDER — METOPROLOL SUCCINATE ER 100 MG PO TB24: 100.0000 mg | ORAL_TABLET | Freq: Every day | ORAL | Status: DC

## 2014-01-03 NOTE — Patient Instructions (Addendum)
Check with your insurance to see if they will cover the pneumonia shot. Get a flu shot at the pharmacy.   Take care.  Glad to see you.  Try to keep working on your weight.  Recheck 1 year at a physical.

## 2014-01-03 NOTE — Progress Notes (Signed)
Pre visit review using our clinic review tool, if applicable. No additional management support is needed unless otherwise documented below in the visit note.  I have personally reviewed the Medicare Annual Wellness questionnaire and have noted 1. The patient's medical and social history 2. Their use of alcohol, tobacco or illicit drugs 3. Their current medications and supplements 4. The patient's functional ability including ADL's, fall risks, home safety risks and hearing or visual             impairment. 5. Diet and physical activities 6. Evidence for depression or mood disorders  The patients weight, height, BMI have been recorded in the chart and visual acuity is per eye clinic.  I have made referrals, counseling and provided education to the patient based review of the above and I have provided the pt with a written personalized care plan for preventive services.  Provider list updated- see scanned forms.  Routine anticipatory guidance given to patient.  See health maintenance.  Flu TBD at pharmacy.  Shingles 2014 PNA TBD at pharmacy.  Tetanus 2009 Colonoscopy 2012 Prostate cancer screening per Dr. Rogers Blocker Advance directive- wife designated if patient were incapacitated.  Cognitive function addressed- see scanned forms- and if abnormal then additional documentation follows.   Hypertension:    Using medication without problems or lightheadedness: yes Chest pain with exertion:no Edema:no Short of breath:no  Gout- rare flares, one episode in early 2015.  Colchicine helps. Taking only with a flare. Labs d/w pt.   Elevated Cholesterol: Using medications without problems:yes Muscle aches: no Diet compliance: limited Exercise:limited Other complaints: caring for his mother, working to keep up 2 households.  Father died on hospice.  Offered support.  He is trying to make time for himself, for diet and exercise.   PMH and SH reviewed  Meds, vitals, and allergies reviewed.   ROS:  See HPI.  Otherwise negative.    GEN: nad, alert and oriented HEENT: mucous membranes moist NECK: supple w/o LA CV: rrr. PULM: ctab, no inc wob ABD: soft, +bs EXT: no edema SKIN: no acute rash

## 2014-01-04 NOTE — Assessment & Plan Note (Signed)
Reasonable control given his lack of exercise, weight, caregiver burden.  He'll try to make some time for himself, will continue meds as is.  He agrees.

## 2014-01-04 NOTE — Assessment & Plan Note (Signed)
He continues to care for his mother, I offered my support.

## 2014-01-04 NOTE — Assessment & Plan Note (Signed)
Continue statin, TG up, d/w pt.  LDL okay.  He'll try to work on weight, diet, exercise.

## 2014-01-04 NOTE — Assessment & Plan Note (Signed)
Rare flares, continue as is.  No proph meds at this point. D/w pt.

## 2014-01-04 NOTE — Assessment & Plan Note (Signed)
Per uro.  

## 2014-01-04 NOTE — Assessment & Plan Note (Signed)
Flu TBD at pharmacy.  Shingles 2014 PNA TBD at pharmacy.  Tetanus 2009 Colonoscopy 2012 Prostate cancer screening per Dr. Rogers Blocker Advance directive- wife designated if patient were incapacitated.  Cognitive function addressed- see scanned forms- and if abnormal then additional documentation follows.

## 2014-01-16 DIAGNOSIS — Z23 Encounter for immunization: Secondary | ICD-10-CM | POA: Diagnosis not present

## 2014-07-24 DIAGNOSIS — N5201 Erectile dysfunction due to arterial insufficiency: Secondary | ICD-10-CM | POA: Diagnosis not present

## 2014-07-24 DIAGNOSIS — Z125 Encounter for screening for malignant neoplasm of prostate: Secondary | ICD-10-CM | POA: Diagnosis not present

## 2014-07-24 DIAGNOSIS — D400 Neoplasm of uncertain behavior of prostate: Secondary | ICD-10-CM | POA: Diagnosis not present

## 2014-09-10 ENCOUNTER — Encounter: Payer: Self-pay | Admitting: Family Medicine

## 2014-09-10 LAB — PSA: Prostate Specific Ag, Serum: 0.05

## 2014-12-31 DIAGNOSIS — Z86010 Personal history of colonic polyps: Secondary | ICD-10-CM | POA: Diagnosis not present

## 2015-01-17 ENCOUNTER — Other Ambulatory Visit: Payer: Self-pay | Admitting: Family Medicine

## 2015-01-17 DIAGNOSIS — E785 Hyperlipidemia, unspecified: Secondary | ICD-10-CM

## 2015-01-17 DIAGNOSIS — M109 Gout, unspecified: Secondary | ICD-10-CM

## 2015-01-20 ENCOUNTER — Other Ambulatory Visit (INDEPENDENT_AMBULATORY_CARE_PROVIDER_SITE_OTHER): Payer: Medicare Other

## 2015-01-20 DIAGNOSIS — E785 Hyperlipidemia, unspecified: Secondary | ICD-10-CM

## 2015-01-20 DIAGNOSIS — M109 Gout, unspecified: Secondary | ICD-10-CM

## 2015-01-20 LAB — LIPID PANEL
Cholesterol: 190 mg/dL (ref 0–200)
HDL: 44 mg/dL (ref >39.00)
LDL Cholesterol: 117 mg/dL — ABNORMAL HIGH (ref 0–99)
NonHDL: 146.48
Total CHOL/HDL Ratio: 4
Triglycerides: 148 mg/dL (ref 0.0–149.0)
VLDL: 29.6 mg/dL (ref 0.0–40.0)

## 2015-01-20 LAB — COMPREHENSIVE METABOLIC PANEL
ALT: 27 U/L (ref 0–53)
AST: 25 U/L (ref 0–37)
Albumin: 3.9 g/dL (ref 3.5–5.2)
Alkaline Phosphatase: 104 U/L (ref 39–117)
BUN: 16 mg/dL (ref 6–23)
CO2: 28 mEq/L (ref 19–32)
Calcium: 9.2 mg/dL (ref 8.4–10.5)
Chloride: 103 mEq/L (ref 96–112)
Creatinine, Ser: 1.09 mg/dL (ref 0.40–1.50)
GFR: 71.68 mL/min (ref >60.00)
Glucose, Bld: 104 mg/dL — ABNORMAL HIGH (ref 70–99)
Potassium: 3.9 mEq/L (ref 3.5–5.1)
Sodium: 139 mEq/L (ref 135–145)
Total Bilirubin: 0.6 mg/dL (ref 0.2–1.2)
Total Protein: 6.9 g/dL (ref 6.0–8.3)

## 2015-01-20 LAB — URIC ACID: Uric Acid, Serum: 8.1 mg/dL — ABNORMAL HIGH (ref 4.0–7.8)

## 2015-01-22 DIAGNOSIS — Z23 Encounter for immunization: Secondary | ICD-10-CM | POA: Diagnosis not present

## 2015-01-27 ENCOUNTER — Encounter: Payer: Self-pay | Admitting: Family Medicine

## 2015-01-27 ENCOUNTER — Ambulatory Visit (INDEPENDENT_AMBULATORY_CARE_PROVIDER_SITE_OTHER): Payer: Medicare Other | Admitting: Family Medicine

## 2015-01-27 VITALS — BP 118/68 | HR 66 | Temp 98.6°F | Ht 68.0 in | Wt 228.5 lb

## 2015-01-27 DIAGNOSIS — I10 Essential (primary) hypertension: Secondary | ICD-10-CM

## 2015-01-27 DIAGNOSIS — Z0000 Encounter for general adult medical examination without abnormal findings: Secondary | ICD-10-CM

## 2015-01-27 DIAGNOSIS — Z7189 Other specified counseling: Secondary | ICD-10-CM

## 2015-01-27 DIAGNOSIS — Z119 Encounter for screening for infectious and parasitic diseases, unspecified: Secondary | ICD-10-CM | POA: Diagnosis not present

## 2015-01-27 DIAGNOSIS — E785 Hyperlipidemia, unspecified: Secondary | ICD-10-CM

## 2015-01-27 MED ORDER — COLCHICINE 0.6 MG PO TABS: 0.6000 mg | ORAL_TABLET | Freq: Every day | ORAL | Status: DC

## 2015-01-27 MED ORDER — HYDROCHLOROTHIAZIDE 12.5 MG PO CAPS: 12.5000 mg | ORAL_CAPSULE | ORAL | Status: DC

## 2015-01-27 MED ORDER — SIMVASTATIN 40 MG PO TABS: 40.0000 mg | ORAL_TABLET | Freq: Every day | ORAL | Status: DC

## 2015-01-27 MED ORDER — METOPROLOL SUCCINATE ER 100 MG PO TB24: 100.0000 mg | ORAL_TABLET | Freq: Every day | ORAL | Status: DC

## 2015-01-27 NOTE — Progress Notes (Signed)
Pre visit review using our clinic review tool, if applicable. No additional management support is needed unless otherwise documented below in the visit note.  I have personally reviewed the Medicare Annual Wellness questionnaire and have noted 1. The patient's medical and social history 2. Their use of alcohol, tobacco or illicit drugs 3. Their current medications and supplements 4. The patient's functional ability including ADL's, fall risks, home safety risks and hearing or visual             impairment. 5. Diet and physical activities 6. Evidence for depression or mood disorders  The patients weight, height, BMI have been recorded in the chart and visual acuity is per eye clinic.  I have made referrals, counseling and provided education to the patient based review of the above and I have provided the pt with a written personalized care plan for preventive services.  Provider list updated- see scanned forms.  Routine anticipatory guidance given to patient.  See health maintenance.  Flu done at pharmacy 01/22/15.   Shingles 2014 PNA TBD at pharmacy. Encouraged.   Tetanus 2009 Colonoscopy 2012, f/u pending next week with Dr. Vira Agar.   Prostate cancer screening per Dr. Rogers Blocker Advance directive- wife designated if patient were incapacitated.  Cognitive function addressed- see scanned forms- and if abnormal then additional documentation follows.  Pt opts in for HCV screening.  D/w pt re: routine screening.   His mother is now on hospice in a SNF.   Hypertension:    Using medication without problems or lightheadedness: yes Chest pain with exertion:no Edema:no Short of breath: no Labs d/w pt.   Rare gout flares.  Labs d/w pt.    Elevated Cholesterol: Using medications without problems:yes Muscle aches: no Diet compliance:  Yes, as best he can.  Caring for his mother complicates his situation.  Exercise: limited.   PMH and SH reviewed  Meds, vitals, and allergies reviewed.    ROS: See HPI.  Otherwise negative.    GEN: nad, alert and oriented HEENT: mucous membranes moist NECK: supple w/o LA CV: rrr. PULM: ctab, no inc wob ABD: soft, +bs EXT: no edema SKIN: no acute rash Soft umbilical hernia noted.

## 2015-01-27 NOTE — Patient Instructions (Signed)
See about getting the pneumonia shot at the pharmacy.  Take care.  Glad to see you.   Let me know if I can be of service.

## 2015-01-29 DIAGNOSIS — Z7189 Other specified counseling: Secondary | ICD-10-CM | POA: Insufficient documentation

## 2015-01-29 NOTE — Assessment & Plan Note (Signed)
Advance directive- wife designated if patient were incapacitated.  

## 2015-01-29 NOTE — Assessment & Plan Note (Signed)
Reasonable control, continue current meds.  D/w pt about diet and weight, exercise, labs.  He agrees.

## 2015-01-29 NOTE — Assessment & Plan Note (Signed)
Flu done at pharmacy 01/22/15.   Shingles 2014 PNA TBD at pharmacy. Encouraged.   Tetanus 2009 Colonoscopy 2012, f/u pending next week with Dr. Vira Agar.   Prostate cancer screening per Dr. Rogers Blocker Advance directive- wife designated if patient were incapacitated.  Cognitive function addressed- see scanned forms- and if abnormal then additional documentation follows.  Pt opts in for HCV screening.  D/w pt re: routine screening.   His mother is now on hospice in a SNF.

## 2015-02-06 ENCOUNTER — Encounter
Admission: RE | Disposition: A | Discharge status: Home / Self Care | Payer: Self-pay | Source: Ambulatory Visit | Attending: Unknown Physician Specialty

## 2015-02-06 ENCOUNTER — Encounter: Payer: Self-pay | Admitting: *Deleted

## 2015-02-06 ENCOUNTER — Ambulatory Visit: Payer: Medicare Other | Admitting: Anesthesiology

## 2015-02-06 ENCOUNTER — Ambulatory Visit
Admission: RE | Admit: 2015-02-06 | Discharge: 2015-02-06 | Disposition: A | Discharge status: Home / Self Care | Payer: Medicare Other | Source: Ambulatory Visit | Attending: Unknown Physician Specialty | Admitting: Unknown Physician Specialty

## 2015-02-06 DIAGNOSIS — K5730 Diverticulosis of large intestine without perforation or abscess without bleeding: Secondary | ICD-10-CM | POA: Diagnosis not present

## 2015-02-06 DIAGNOSIS — Z86010 Personal history of colonic polyps: Secondary | ICD-10-CM | POA: Insufficient documentation

## 2015-02-06 DIAGNOSIS — Z8546 Personal history of malignant neoplasm of prostate: Secondary | ICD-10-CM | POA: Insufficient documentation

## 2015-02-06 DIAGNOSIS — Z79899 Other long term (current) drug therapy: Secondary | ICD-10-CM | POA: Diagnosis not present

## 2015-02-06 DIAGNOSIS — Z825 Family history of asthma and other chronic lower respiratory diseases: Secondary | ICD-10-CM | POA: Diagnosis not present

## 2015-02-06 DIAGNOSIS — Z9842 Cataract extraction status, left eye: Secondary | ICD-10-CM | POA: Diagnosis not present

## 2015-02-06 DIAGNOSIS — Z9079 Acquired absence of other genital organ(s): Secondary | ICD-10-CM | POA: Diagnosis not present

## 2015-02-06 DIAGNOSIS — D122 Benign neoplasm of ascending colon: Secondary | ICD-10-CM | POA: Diagnosis not present

## 2015-02-06 DIAGNOSIS — Z9889 Other specified postprocedural states: Secondary | ICD-10-CM | POA: Diagnosis not present

## 2015-02-06 DIAGNOSIS — D123 Benign neoplasm of transverse colon: Secondary | ICD-10-CM | POA: Diagnosis not present

## 2015-02-06 DIAGNOSIS — Z8261 Family history of arthritis: Secondary | ICD-10-CM | POA: Diagnosis not present

## 2015-02-06 DIAGNOSIS — Z9841 Cataract extraction status, right eye: Secondary | ICD-10-CM | POA: Insufficient documentation

## 2015-02-06 DIAGNOSIS — Z7982 Long term (current) use of aspirin: Secondary | ICD-10-CM | POA: Diagnosis not present

## 2015-02-06 DIAGNOSIS — M109 Gout, unspecified: Secondary | ICD-10-CM | POA: Diagnosis not present

## 2015-02-06 DIAGNOSIS — E785 Hyperlipidemia, unspecified: Secondary | ICD-10-CM | POA: Insufficient documentation

## 2015-02-06 DIAGNOSIS — Z8249 Family history of ischemic heart disease and other diseases of the circulatory system: Secondary | ICD-10-CM | POA: Insufficient documentation

## 2015-02-06 DIAGNOSIS — K640 First degree hemorrhoids: Secondary | ICD-10-CM | POA: Insufficient documentation

## 2015-02-06 DIAGNOSIS — I10 Essential (primary) hypertension: Secondary | ICD-10-CM | POA: Insufficient documentation

## 2015-02-06 DIAGNOSIS — Z803 Family history of malignant neoplasm of breast: Secondary | ICD-10-CM | POA: Insufficient documentation

## 2015-02-06 DIAGNOSIS — K621 Rectal polyp: Secondary | ICD-10-CM | POA: Diagnosis not present

## 2015-02-06 DIAGNOSIS — K635 Polyp of colon: Secondary | ICD-10-CM | POA: Diagnosis not present

## 2015-02-06 DIAGNOSIS — D128 Benign neoplasm of rectum: Secondary | ICD-10-CM | POA: Diagnosis not present

## 2015-02-06 HISTORY — PX: COLONOSCOPY WITH PROPOFOL: SHX5780

## 2015-02-06 LAB — HM COLONOSCOPY

## 2015-02-06 SURGERY — COLONOSCOPY WITH PROPOFOL
Anesthesia: General

## 2015-02-06 MED ORDER — PHENYLEPHRINE HCL 10 MG/ML IJ SOLN
INTRAMUSCULAR | Status: DC | PRN
  Administered 2015-02-06: 100 ug via INTRAVENOUS
  Administered 2015-02-06: 200 ug via INTRAVENOUS
  Administered 2015-02-06 (×4): 100 ug via INTRAVENOUS

## 2015-02-06 MED ORDER — PROPOFOL 10 MG/ML IV BOLUS
INTRAVENOUS | Status: DC | PRN
  Administered 2015-02-06: 80 mg via INTRAVENOUS

## 2015-02-06 MED ORDER — SODIUM CHLORIDE 0.9 % IV SOLN: INTRAVENOUS | Status: DC

## 2015-02-06 MED ORDER — PROPOFOL 500 MG/50ML IV EMUL
INTRAVENOUS | Status: DC | PRN
  Administered 2015-02-06: 140 ug/kg/min via INTRAVENOUS

## 2015-02-06 MED ORDER — SODIUM CHLORIDE 0.9 % IV SOLN
INTRAVENOUS | Status: DC
  Administered 2015-02-06 (×3): via INTRAVENOUS

## 2015-02-06 NOTE — Anesthesia Preprocedure Evaluation (Signed)
Anesthesia Evaluation  Patient identified by MRN, date of birth, ID band Patient awake    Reviewed: Allergy & Precautions, H&P , NPO status , Patient's Chart, lab work & pertinent test results, reviewed documented beta blocker date and time   Airway Mallampati: III  TM Distance: >3 FB Neck ROM: full    Dental no notable dental hx. (+) Caps   Pulmonary neg pulmonary ROS,    Pulmonary exam normal breath sounds clear to auscultation       Cardiovascular Exercise Tolerance: Good hypertension, On Medications and On Home Beta Blockers (-) angina(-) CAD, (-) Past MI, (-) Cardiac Stents and (-) CABG Normal cardiovascular exam(-) dysrhythmias (-) Valvular Problems/Murmurs Rhythm:regular Rate:Normal     Neuro/Psych negative neurological ROS  negative psych ROS   GI/Hepatic Neg liver ROS, GERD  Medicated and Controlled,  Endo/Other  negative endocrine ROS  Renal/GU negative Renal ROS  negative genitourinary   Musculoskeletal   Abdominal   Peds  Hematology negative hematology ROS (+)   Anesthesia Other Findings Past Medical History:   Gout                                                         Hyperlipidemia                                               Hypertension                                                 Prostate cancer ([place])                                        Reproductive/Obstetrics negative OB ROS                             Anesthesia Physical Anesthesia Plan  ASA: III  Anesthesia Plan: General   Post-op Pain Management:    Induction:   Airway Management Planned:   Additional Equipment:   Intra-op Plan:   Post-operative Plan:   Informed Consent: I have reviewed the patients History and Physical, chart, labs and discussed the procedure including the risks, benefits and alternatives for the proposed anesthesia with the patient or authorized representative who has  indicated his/her understanding and acceptance.   Dental Advisory Given  Plan Discussed with: Anesthesiologist, CRNA and Surgeon  Anesthesia Plan Comments:         Anesthesia Quick Evaluation

## 2015-02-06 NOTE — H&P (Signed)
Primary Care Physician:  Elsie Stain, MD Primary Gastroenterologist:  Dr. Vira Agar  Pre-Procedure History & Physical: HPI:  Tristan Booker is a 69 y.o. male is here for an colonoscopy.   Past Medical History  Diagnosis Date  . Gout   . Hyperlipidemia   . Hypertension   . Prostate cancer [facility])     Past Surgical History  Procedure Laterality Date  . Hernia repair  1974    left  . Prostate biopsy  02/27/2002    negative x 6  . Cataract extraction  12/2004    right  . Cataract extraction  02/2005    left  . Prostatectomy  05/25/09    Dr. Rogers Blocker    Prior to Admission medications   Medication Sig Start Date End Date Taking? Authorizing Provider  aspirin 81 MG tablet Take 81 mg by mouth daily.      Historical Provider, MD  colchicine 0.6 MG tablet Take 1 tablet (0.6 mg total) by mouth daily. As needed for gout pain 01/27/15   Tonia Ghent, MD  docusate sodium (COLACE) 100 MG capsule Take 100 mg by mouth daily.    Historical Provider, MD  hydrochlorothiazide (MICROZIDE) 12.5 MG capsule Take 1 capsule (12.5 mg total) by mouth every morning. 01/27/15   Tonia Ghent, MD  metoprolol succinate (TOPROL XL) 100 MG 24 hr tablet Take 1 tablet (100 mg total) by mouth daily. Take with or immediately following a meal. 01/27/15   Tonia Ghent, MD  ranitidine (ZANTAC) 75 MG tablet As needed.     Historical Provider, MD  simvastatin (ZOCOR) 40 MG tablet Take 1 tablet (40 mg total) by mouth at bedtime. 01/27/15   Tonia Ghent, MD    Allergies as of 01/07/2015  . (No Known Allergies)    Family History  Problem Relation Age of Onset  . Cancer Mother     breast, s/p mastectomy 1970's  . Heart failure Mother   . Emphysema Father     smoker  . COPD Father     Past smoker  . Heart failure Father   . Arthritis Brother   . Colon cancer Neg Hx     Social History   Social History  . Marital Status: Married    Spouse Name: N/A  . Number of Children: 3  . Years of Education: N/A    Occupational History  . vinyl siding install, window replacement     part time   Social History Main Topics  . Smoking status: Never Smoker   . Smokeless tobacco: Former Systems developer  . Alcohol Use: 4.2 oz/week    0 Standard drinks or equivalent, 7 Cans of beer per week     Comment: beer, 12/week  . Drug Use: No  . Sexual Activity: Yes   Other Topics Concern  . 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 Neck:  Supple; no masses or thyromegaly. Lungs:  Clear throughout to auscultation.  Heart:  Regular rate and rhythm. Abdomen:  Soft, nontender and nondistended. Normal bowel sounds, without guarding, and without rebound.   Neurologic:  Alert and  oriented x4;  grossly normal neurologically.  Impression/Plan: KYPTON ARCIA is here for an colonoscopy to be performed for [facility] colon polyps  Risks, benefits, limitations, and alternatives regarding  colonoscopy have been reviewed with the patient.  Questions have been answered.  All parties agreeable.   Gaylyn Cheers, MD  02/06/2015, 11:25 AM

## 2015-02-06 NOTE — Transfer of Care (Signed)
Immediate Anesthesia Transfer of Care Note  Patient: Tristan Booker  Procedure(s) Performed: Procedure(s): COLONOSCOPY WITH PROPOFOL (N/A)  Patient Location: PACU  Anesthesia Type:General  Level of Consciousness: awake, alert  and oriented  Airway & Oxygen Therapy: Patient Spontanous Breathing and Patient connected to nasal cannula oxygen  Post-op Assessment: Report given to RN and Post -op Vital signs reviewed and stable  Post vital signs: Reviewed and stable  Last Vitals:  Filed Vitals:   02/06/15 1007  BP: 162/79  Pulse: 86  Temp: 36.6 C  Resp: 16    Complications: No apparent anesthesia complications

## 2015-02-06 NOTE — Op Note (Signed)
[facility] Gastroenterology Patient Name: Tristan Booker Procedure Date: 02/06/2015 11:14 AM MRN: [id number] Account #: [id number] Date of Birth: [birth year] Admit Type: Outpatient Age: 69 Room: [facility] ENDO ROOM 1 Gender: Male Note Status: Finalized Procedure:         Colonoscopy Indications:       High risk colon cancer surveillance: Personal history of                     colonic polyps Providers:         Manya Silvas, MD Referring MD:      Elveria Rising. Damita Dunnings (Referring MD) Medicines:         Propofol per Anesthesia Complications:     No immediate complications. Procedure:         Pre-Anesthesia Assessment:                    - After reviewing the risks and benefits, the patient was                     deemed in satisfactory condition to undergo the procedure.                    After obtaining informed consent, the colonoscope was                     passed under direct vision. Throughout the procedure, the                     patient's blood pressure, pulse, and oxygen saturations                     were monitored continuously. The Colonoscope was                     introduced through the anus and advanced to the the cecum,                     identified by appendiceal orifice and ileocecal valve. The                     colonoscopy was performed without difficulty. The patient                     tolerated the procedure well. The quality of the bowel                     preparation was good. Findings:      A diminutive polyp was found in the rectum. The polyp was sessile. The       polyp was removed with a cold biopsy forceps. Resection and retrieval       were complete.      Two sessile polyps were found in the distal ascending colon. The polyps       were diminutive in size. These polyps were removed with a cold snare.       Resection and retrieval were complete.      Many medium-mouthed diverticula were found in the sigmoid colon and in       the  descending colon.      Internal hemorrhoids were found during endoscopy. The hemorrhoids were       small and Grade I (internal hemorrhoids that do not prolapse). Impression:        - One diminutive polyp  in the rectum. Resected and                     retrieved.                    - Two diminutive polyps in the distal ascending colon.                     Resected and retrieved.                    - Diverticulosis in the sigmoid colon and in the                     descending colon.                    - Internal hemorrhoids. Recommendation:    - Await pathology results. Manya Silvas, MD 02/06/2015 12:06:19 PM This report has been signed electronically. Number of Addenda: 0 Note Initiated On: 02/06/2015 11:14 AM Scope Withdrawal Time: 0 hours 24 minutes 11 seconds  Total Procedure Duration: 0 hours 34 minutes 40 seconds       [facility]

## 2015-02-07 ENCOUNTER — Encounter: Payer: Self-pay | Admitting: Unknown Physician Specialty

## 2015-02-07 LAB — SURGICAL PATHOLOGY

## 2015-02-07 NOTE — Anesthesia Postprocedure Evaluation (Signed)
  Anesthesia Post-op Note  Patient: Tristan Booker  Procedure(s) Performed: Procedure(s): COLONOSCOPY WITH PROPOFOL (N/A)  Anesthesia type:General  Patient location: PACU  Post pain: Pain level controlled  Post assessment: Post-op Vital signs reviewed, Patient's Cardiovascular Status Stable, Respiratory Function Stable, Patent Airway and No signs of Nausea or vomiting  Post vital signs: Reviewed and stable  Last Vitals:  Filed Vitals:   02/06/15 1205  BP:   Pulse:   Temp: 36.1 C  Resp:     Level of consciousness: awake, alert  and patient cooperative  Complications: No apparent anesthesia complications

## 2015-02-24 ENCOUNTER — Encounter: Payer: Self-pay | Admitting: Family Medicine

## 2015-03-02 ENCOUNTER — Encounter: Payer: Self-pay | Admitting: Family Medicine

## 2015-03-20 ENCOUNTER — Encounter: Payer: Self-pay | Admitting: *Deleted

## 2015-07-21 DIAGNOSIS — C61 Malignant neoplasm of prostate: Secondary | ICD-10-CM | POA: Diagnosis not present

## 2015-07-21 DIAGNOSIS — Z125 Encounter for screening for malignant neoplasm of prostate: Secondary | ICD-10-CM | POA: Diagnosis not present

## 2015-07-21 DIAGNOSIS — N5201 Erectile dysfunction due to arterial insufficiency: Secondary | ICD-10-CM | POA: Diagnosis not present

## 2015-07-21 DIAGNOSIS — D400 Neoplasm of uncertain behavior of prostate: Secondary | ICD-10-CM | POA: Diagnosis not present

## 2015-10-14 DIAGNOSIS — H01003 Unspecified blepharitis right eye, unspecified eyelid: Secondary | ICD-10-CM | POA: Diagnosis not present

## 2015-10-27 ENCOUNTER — Telehealth: Payer: Self-pay | Admitting: Family Medicine

## 2015-10-27 NOTE — Telephone Encounter (Signed)
Okay for tomorrow since this isn't acute and has been going on since Jan 2017.  Thanks.

## 2015-10-27 NOTE — Telephone Encounter (Signed)
This TH note did not come to my desk top; found on portal ; pt has appt 10/28/15 at 8:30. Is that OK?

## 2015-10-27 NOTE — Telephone Encounter (Signed)
Patient Name: Tristan Booker  DOB: 1946/03/18    Initial Comment Husband has a physical schedule for NOV but he is having slowness in speech and movement in body. Slurred a little   Nurse Assessment  Nurse: Leilani Merl, RN, Heather Date/Time (Eastern Time): 10/27/2015 10:56:34 AM  Confirm and document reason for call. If symptomatic, describe symptoms. You must click the next button to save text entered. ---Husband has a physical scheduled for Nov but he is having slowness in speech and movement in body. This started in Jan of this year and is getting gradually worse.  Has the patient traveled out of the country within the last 30 days? ---Not Applicable  Does the patient have any new or worsening symptoms? ---Yes  Will a triage be completed? ---Yes  Related visit to physician within the last 2 weeks? ---No  Does the PT have any chronic conditions? (i.e. diabetes, asthma, etc.) ---Yes  List chronic conditions. ---See MR  Is this a behavioral health or substance abuse call? ---No     Guidelines    Guideline Title Affirmed Question Affirmed Notes  Neurologic Deficit [1] Loss of speech or garbled speech AND [2] is a chronic symptom (recurrent or ongoing AND present > 4 weeks)    Final Disposition User   See PCP When Office is Open (within 3 days) Standifer, RN, Nira Conn    Comments  Appt made with Dr. Damita Dunnings for tomorrow at 8:30 am.   Referrals  REFERRED TO PCP OFFICE   Disagree/Comply: Comply

## 2015-10-28 ENCOUNTER — Encounter: Payer: Self-pay | Admitting: Family Medicine

## 2015-10-28 ENCOUNTER — Ambulatory Visit (INDEPENDENT_AMBULATORY_CARE_PROVIDER_SITE_OTHER): Payer: Medicare Other | Admitting: Family Medicine

## 2015-10-28 VITALS — BP 122/76 | HR 52 | Temp 98.4°F | Wt 228.2 lb

## 2015-10-28 DIAGNOSIS — Z125 Encounter for screening for malignant neoplasm of prostate: Secondary | ICD-10-CM

## 2015-10-28 DIAGNOSIS — R258 Other abnormal involuntary movements: Secondary | ICD-10-CM | POA: Diagnosis not present

## 2015-10-28 DIAGNOSIS — R739 Hyperglycemia, unspecified: Secondary | ICD-10-CM

## 2015-10-28 DIAGNOSIS — R413 Other amnesia: Secondary | ICD-10-CM

## 2015-10-28 LAB — COMPREHENSIVE METABOLIC PANEL
ALT: 26 U/L (ref 0–53)
AST: 21 U/L (ref 0–37)
Albumin: 4.3 g/dL (ref 3.5–5.2)
Alkaline Phosphatase: 106 U/L (ref 39–117)
BUN: 14 mg/dL (ref 6–23)
CO2: 28 mEq/L (ref 19–32)
Calcium: 9.5 mg/dL (ref 8.4–10.5)
Chloride: 102 mEq/L (ref 96–112)
Creatinine, Ser: 1.02 mg/dL (ref 0.40–1.50)
GFR: 77.21 mL/min (ref >60.00)
Glucose, Bld: 97 mg/dL (ref 70–99)
Potassium: 4.2 mEq/L (ref 3.5–5.1)
Sodium: 140 mEq/L (ref 135–145)
Total Bilirubin: 0.6 mg/dL (ref 0.2–1.2)
Total Protein: 6.9 g/dL (ref 6.0–8.3)

## 2015-10-28 LAB — CBC WITH DIFFERENTIAL/PLATELET
Basophils Absolute: 0 10*3/uL (ref 0.0–0.1)
Basophils Relative: 0.5 % (ref 0.0–3.0)
Eosinophils Absolute: 0.2 10*3/uL (ref 0.0–0.7)
Eosinophils Relative: 2.6 % (ref 0.0–5.0)
HCT: 50.4 % (ref 39.0–52.0)
Hemoglobin: 17.3 g/dL — ABNORMAL HIGH (ref 13.0–17.0)
Lymphocytes Relative: 30.8 % (ref 12.0–46.0)
Lymphs Abs: 1.9 10*3/uL (ref 0.7–4.0)
MCHC: 34.3 g/dL (ref 30.0–36.0)
MCV: 94.1 fl (ref 78.0–100.0)
Monocytes Absolute: 0.6 10*3/uL (ref 0.1–1.0)
Monocytes Relative: 9.7 % (ref 3.0–12.0)
Neutro Abs: 3.4 10*3/uL (ref 1.4–7.7)
Neutrophils Relative %: 56.4 % (ref 43.0–77.0)
Platelets: 191 10*3/uL (ref 150.0–400.0)
RBC: 5.36 Mil/uL (ref 4.22–5.81)
RDW: 13.4 % (ref 11.5–15.5)
WBC: 6 10*3/uL (ref 4.0–10.5)

## 2015-10-28 LAB — HEMOGLOBIN A1C: Hgb A1c MFr Bld: 5.3 % (ref 4.6–6.5)

## 2015-10-28 LAB — VITAMIN B12: Vitamin B-12: 382 pg/mL (ref 211–911)

## 2015-10-28 LAB — PSA, MEDICARE: PSA: 0.07 ng/ml — ABNORMAL LOW (ref 0.10–4.00)

## 2015-10-28 LAB — TSH: TSH: 2.04 u[IU]/mL (ref 0.35–4.50)

## 2015-10-28 NOTE — Progress Notes (Signed)
Pre visit review using our clinic review tool, if applicable. No additional management support is needed unless otherwise documented below in the visit note. 

## 2015-10-28 NOTE — Patient Instructions (Signed)
Go to the lab on the way out.  We'll contact you with your lab report. If all normal or unremarkable, then we'll likely set up a MRI.  Take care.  Glad to see you.

## 2015-10-28 NOTE — Progress Notes (Signed)
Over the last months he has noted some changes.  Initially noted changes in vision.  Has seen eye clinic, needed minor changes in his lenses.  Still with blurry vision, L>R.  No loss of vision.  Speech has slowed down.  He had notes some slurred speech, others thought he was intoxicated when he wasn't.  All of this is over the last 8 months.  No handwriting changes. Some tremor in the hands B, when trying to do delicate work, but not at rest.  "Everything has slowed down" meaning pace of speech, pace of walking.  No FCNAVD.  He doesn't feel sick o/w.  Mood is okay generally but some irritability noted.  No SI/HI.  No FH of similar.  Mother had dementia.  He hasn't noted sig memory loss.  Others have noted some memory loss call pt.  labsbut no red flag events.    Meds, vitals, and allergies reviewed.   ROS: Per HPI unless specifically indicated in ROS section   Alert and oriented.   Can do math and read a watch.  3/3 recall, easily done.    GEN: nad, alert and oriented HEENT: mucous membranes moist NECK: supple w/o LA CV: rrr.  no murmur PULM: ctab, no inc wob ABD: soft, +bs EXT: no edema SKIN: no acute rash CN 2-12 wnl B, S/S/DTR wnl x4 but symmetric bradykinesia noted w/o tremor or cogwheeling.  Speech is slightly slow but fluent and not slurred.  Normal cerebellar testing.

## 2015-10-29 DIAGNOSIS — R258 Other abnormal involuntary movements: Secondary | ICD-10-CM | POA: Insufficient documentation

## 2015-10-29 DIAGNOSIS — G231 Progressive supranuclear ophthalmoplegia [Steele-Richardson-Olszewski]: Secondary | ICD-10-CM | POA: Insufficient documentation

## 2015-10-29 NOTE — Assessment & Plan Note (Signed)
With some speech and motor slowing, memory changes noted by others.  No tremor at rest.  No focal weakness or focal limitations o/w on exam.  dw pt.  Check labs today.  parkinsons less likely given no resting tremor but in the ddx.  Depression, etc, possible.  If labs unremarkable, then likely MRI brain.  See notes on labs.  He agrees.  Given timeline, okay for outpatient f/u. >25 minutes spent in face to face time with patient, >50% spent in counselling or coordination of care.

## 2015-10-30 ENCOUNTER — Telehealth: Payer: Self-pay | Admitting: Family Medicine

## 2015-10-30 NOTE — Telephone Encounter (Signed)
Refer to lab notes 

## 2015-10-30 NOTE — Telephone Encounter (Signed)
Pt returned your call  [phone]

## 2015-10-31 ENCOUNTER — Other Ambulatory Visit: Payer: Self-pay | Admitting: Family Medicine

## 2015-11-02 NOTE — Telephone Encounter (Signed)
Sent. Thanks.   

## 2015-11-11 ENCOUNTER — Ambulatory Visit
Admission: RE | Admit: 2015-11-11 | Discharge: 2015-11-11 | Disposition: A | Discharge status: Home / Self Care | Payer: Medicare Other | Source: Ambulatory Visit | Attending: Family Medicine | Admitting: Family Medicine

## 2015-11-11 DIAGNOSIS — H538 Other visual disturbances: Secondary | ICD-10-CM | POA: Diagnosis not present

## 2015-11-11 DIAGNOSIS — R258 Other abnormal involuntary movements: Secondary | ICD-10-CM | POA: Diagnosis not present

## 2015-11-12 ENCOUNTER — Other Ambulatory Visit: Payer: Self-pay | Admitting: Family Medicine

## 2015-11-12 DIAGNOSIS — R258 Other abnormal involuntary movements: Secondary | ICD-10-CM

## 2015-12-30 DIAGNOSIS — Z23 Encounter for immunization: Secondary | ICD-10-CM | POA: Diagnosis not present

## 2016-01-27 DIAGNOSIS — G621 Alcoholic polyneuropathy: Secondary | ICD-10-CM | POA: Diagnosis not present

## 2016-01-27 DIAGNOSIS — R251 Tremor, unspecified: Secondary | ICD-10-CM | POA: Diagnosis not present

## 2016-01-29 ENCOUNTER — Other Ambulatory Visit: Payer: Medicare Other

## 2016-01-29 ENCOUNTER — Ambulatory Visit (INDEPENDENT_AMBULATORY_CARE_PROVIDER_SITE_OTHER): Payer: Medicare Other

## 2016-01-29 VITALS — BP 124/80 | HR 62 | Temp 98.6°F | Ht 68.0 in | Wt 230.2 lb

## 2016-01-29 DIAGNOSIS — Z23 Encounter for immunization: Secondary | ICD-10-CM | POA: Diagnosis not present

## 2016-01-29 DIAGNOSIS — Z119 Encounter for screening for infectious and parasitic diseases, unspecified: Secondary | ICD-10-CM | POA: Diagnosis not present

## 2016-01-29 DIAGNOSIS — Z0000 Encounter for general adult medical examination without abnormal findings: Secondary | ICD-10-CM | POA: Diagnosis not present

## 2016-01-29 NOTE — Progress Notes (Signed)
Subjective:   Tristan Booker is a 70 y.o. male who presents for Medicare Annual/Subsequent preventive examination.  Review of Systems: N/A  Cardiac Risk Factors include: advanced age (>81men, >9 women);male gender;dyslipidemia;hypertension;obesity (BMI >30kg/m2)     Objective:    Vitals: BP 124/80 (BP Location: Right Arm, Patient Position: Sitting, Cuff Size: Normal)   Pulse 62   Temp 98.6 F (37 C) (Oral)   Ht 5\' 8"  (1.727 m) Comment: no shoes  Wt 230 lb 4 oz (104.4 kg)   SpO2 98%   BMI 35.01 kg/m   Body mass index is 35.01 kg/m.  Tobacco History  Smoking Status  . Never Smoker  Smokeless Tobacco  . Former Engineer, structural given: No   Past Medical History:  Diagnosis Date  . Gout   . Hyperlipidemia   . Hypertension   . Prostate cancer [facility])    Past Surgical History:  Procedure Laterality Date  . CATARACT EXTRACTION  12/2004   right  . CATARACT EXTRACTION  02/2005   left  . COLONOSCOPY WITH PROPOFOL N/A 02/06/2015   Procedure: COLONOSCOPY WITH PROPOFOL;  Surgeon: Manya Silvas, MD;  Location: [facility] ENDOSCOPY;  Service: Endoscopy;  Laterality: N/A;  . HERNIA REPAIR  1974   left  . PROSTATE BIOPSY  02/27/2002   negative x 6  . PROSTATECTOMY  05/25/09   Dr. Rogers Blocker   Family History  Problem Relation Age of Onset  . Cancer Mother     breast, s/p mastectomy 1970's  . Heart failure Mother   . Dementia Mother   . Emphysema Father     smoker  . COPD Father     Past smoker  . Heart failure Father   . Arthritis Brother   . Colon cancer Neg Hx    History  Sexual Activity  . Sexual activity: Yes    Outpatient Encounter Prescriptions as of 01/29/2016  Medication Sig  . aspirin 81 MG tablet Take 81 mg by mouth daily.    . colchicine 0.6 MG tablet TAKE ONE TABLET BY MOUTH ONCE DAILY AS NEEDED FOR  GOUT  PAIN  . hydrochlorothiazide (MICROZIDE) 12.5 MG capsule Take 1 capsule (12.5 mg total) by mouth every morning.  . metoprolol succinate (TOPROL XL) 100  MG 24 hr tablet Take 1 tablet (100 mg total) by mouth daily. Take with or immediately following a meal.  . ranitidine (ZANTAC) 75 MG tablet As needed.   . simvastatin (ZOCOR) 40 MG tablet Take 1 tablet (40 mg total) by mouth at bedtime.  . docusate sodium (COLACE) 100 MG capsule Take 100 mg by mouth daily as needed.   . [DISCONTINUED] colchicine 0.6 MG tablet Take 1 tablet (0.6 mg total) by mouth daily. As needed for gout pain   No facility-administered encounter medications on file as of 01/29/2016.     Activities of Daily Living In your present state of health, do you have any difficulty performing the following activities: 01/29/2016  Hearing? N  Vision? Y  Difficulty concentrating or making decisions? N  Walking or climbing stairs? N  Dressing or bathing? N  Doing errands, shopping? N  Preparing Food and eating ? N  Using the Toilet? N  In the past six months, have you accidently leaked urine? N  Do you have problems with loss of bowel control? N  Managing your Medications? N  Managing your Finances? N  Housekeeping or managing your Housekeeping? N  Some recent data might be hidden  Patient Care Team: Tonia Ghent, MD as PCP - General (Family Medicine) Estill Cotta, MD as Consulting Physician (Ophthalmology)   Assessment:     Hearing Screening   125Hz  250Hz  500Hz  1000Hz  2000Hz  3000Hz  4000Hz  6000Hz  8000Hz   Right ear:   40 40 40  0    Left ear:   40 40 40  0    Vision Screening Comments: Last vision exam in June 2017 @ [facility]   Exercise Activities and Dietary recommendations Current Exercise Habits: The patient does not participate in regular exercise at present, Exercise limited by: None identified  Goals    . Increase water intake          Starting 01/29/2016, I will continue to drink at least 6-8 glasses of water daily.       Fall Risk Fall Risk  01/29/2016 01/27/2015 01/01/2013  Falls in the past year? No No No   Depression Screen PHQ 2/9  Scores 01/29/2016 01/27/2015 01/01/2013  PHQ - 2 Score 0 0 0    Cognitive Function MMSE - Mini Mental State Exam 01/29/2016  Orientation to time 5  Orientation to Place 5  Registration 3  Attention/ Calculation 0  Recall 3  Language- name 2 objects 0  Language- repeat 1  Language- follow 3 step command 3  Language- read & follow direction 0  Write a sentence 0  Copy design 0  Total score 20     PLEASE NOTE: A Mini-Cog screen was completed. Maximum score is 20. A value of 0 denotes this part of Folstein MMSE was not completed or the patient failed this part of the Mini-Cog screening.   Mini-Cog Screening Orientation to Time - Max 5 pts Orientation to Place - Max 5 pts Registration - Max 3 pts Recall - Max 3 pts Language Repeat - Max 1 pts Language Follow 3 Step Command - Max 3 pts     Immunization History  Administered Date(s) Administered  . Influenza Split 01/20/2011, 12/28/2011  . Influenza Whole 12/31/2005, 01/14/2010  . Influenza,inj,Quad PF,36+ Mos 01/01/2013  . Influenza-Unspecified 01/22/2015  . Pneumococcal Conjugate-13 01/29/2016  . Td 09/19/1996, 11/30/2007  . Zoster 02/21/2013   Screening Tests Health Maintenance  Topic Date Due  . PNA vac Low Risk Adult (2 of 2 - PPSV23) 01/28/2017  . TETANUS/TDAP  11/29/2017  . COLONOSCOPY  02/06/2020  . INFLUENZA VACCINE  Addressed  . ZOSTAVAX  Completed  . Hepatitis C Screening  Completed      Plan:     I have personally reviewed and addressed the Medicare Annual Wellness questionnaire and have noted the following in the patient's chart:  A. Medical and social history B. Use of alcohol, tobacco or illicit drugs  C. Current medications and supplements D. Functional ability and status E.  Nutritional status F.  Physical activity G. Advance directives H. List of other physicians I.  Hospitalizations, surgeries, and ER visits in previous 12 months J.  [place] to include hearing, vision, cognitive,  depression L. Referrals and appointments - none  In addition, I have reviewed and discussed with patient certain preventive protocols, quality metrics, and best practice recommendations. A written personalized care plan for preventive services as well as general preventive health recommendations were provided to patient.  See attached scanned questionnaire for additional information.   Signed,   Lindell Noe, MHA, BS, LPN Health Coach

## 2016-01-29 NOTE — Progress Notes (Signed)
Pre visit review using our clinic review tool, if applicable. No additional management support is needed unless otherwise documented below in the visit note. 

## 2016-01-29 NOTE — Progress Notes (Signed)
PCP notes:   Health maintenance:  PCV13 - administered Hep C screening - completed  Abnormal screenings:   Hearing - failed  Patient concerns:   None  Nurse concerns:  None  Next PCP appt:   02/02/16 @ 1045  I reviewed health advisor's note, was available for consultation on the day of service listed in this note, and agree with documentation and plan. Elsie Stain, MD.

## 2016-01-29 NOTE — Patient Instructions (Signed)
Tristan Booker , Thank you for taking time to come for your Medicare Wellness Visit. I appreciate your ongoing commitment to your health goals. Please review the following plan we discussed and let me know if I can assist you in the future.   These are the goals we discussed: Goals    . Increase water intake          Starting 01/29/2016, I will continue to drink at least 6-8 glasses of water daily.        This is a list of the screening recommended for you and due dates:  Health Maintenance  Topic Date Due  . Pneumonia vaccines (2 of 2 - PPSV23) 01/28/2017  . Tetanus Vaccine  11/29/2017  . Colon Cancer Screening  02/06/2020  . Flu Shot  Addressed  . Shingles Vaccine  Completed  .  Hepatitis C: One time screening is recommended by Center for Disease Control  (CDC) for  adults born from [birth year] through 1963.   Completed   Preventive Care for Adults  A healthy lifestyle and preventive care can promote health and wellness. Preventive health guidelines for adults include the following key practices.  . A routine yearly physical is a good way to check with your health care provider about your health and preventive screening. It is a chance to share any concerns and updates on your health and to receive a thorough exam.  . Visit your dentist for a routine exam and preventive care every 6 months. Brush your teeth twice a day and floss once a day. Good oral hygiene prevents tooth decay and gum disease.  . The frequency of eye exams is based on your age, health, family medical history, use  of contact lenses, and other factors. Follow your health care provider's ecommendations for frequency of eye exams.  . Eat a healthy diet. Foods like vegetables, fruits, whole grains, low-fat dairy products, and lean protein foods contain the nutrients you need without too many calories. Decrease your intake of foods high in solid fats, added sugars, and salt. Eat the right amount of calories for you. Get  information about a proper diet from your health care provider, if necessary.  . Regular physical exercise is one of the most important things you can do for your health. Most adults should get at least 150 minutes of moderate-intensity exercise (any activity that increases your heart rate and causes you to sweat) each week. In addition, most adults need muscle-strengthening exercises on 2 or more days a week.  Silver Sneakers may be a benefit available to you. To determine eligibility, you may visit the website: www.silversneakers.com or contact program at 858 715 3920 Mon-Fri between 8AM-8PM.   . Maintain a healthy weight. The body mass index (BMI) is a screening tool to identify possible weight problems. It provides an estimate of body fat based on height and weight. Your health care provider can find your BMI and can help you achieve or maintain a healthy weight.   For adults 20 years and older: ? A BMI below 18.5 is considered underweight. ? A BMI of 18.5 to 24.9 is normal. ? A BMI of 25 to 29.9 is considered overweight. ? A BMI of 30 and above is considered obese.   . Maintain normal blood lipids and cholesterol levels by exercising and minimizing your intake of saturated fat. Eat a balanced diet with plenty of fruit and vegetables. Blood tests for lipids and cholesterol should begin at age 69 and be repeated every 5  years. If your lipid or cholesterol levels are high, you are over 50, or you are at high risk for heart disease, you may need your cholesterol levels checked more frequently. Ongoing high lipid and cholesterol levels should be treated with medicines if diet and exercise are not working.  . If you smoke, find out from your health care provider how to quit. If you do not use tobacco, please do not start.  . If you choose to drink alcohol, please do not consume more than 2 drinks per day. One drink is considered to be 12 ounces (355 mL) of beer, 5 ounces (148 mL) of wine, or 1.5  ounces (44 mL) of liquor.  . If you are 79-68 years old, ask your health care provider if you should take aspirin to prevent strokes.  . Use sunscreen. Apply sunscreen liberally and repeatedly throughout the day. You should seek shade when your shadow is shorter than you. Protect yourself by wearing long sleeves, pants, a wide-brimmed hat, and sunglasses year round, whenever you are outdoors.  . Once a month, do a whole body skin exam, using a mirror to look at the skin on your back. Tell your health care provider of new moles, moles that have irregular borders, moles that are larger than a pencil eraser, or moles that have changed in shape or color.

## 2016-01-30 LAB — HEPATITIS C ANTIBODY: HCV Ab: NEGATIVE

## 2016-02-02 ENCOUNTER — Encounter: Payer: Self-pay | Admitting: Family Medicine

## 2016-02-02 ENCOUNTER — Ambulatory Visit (INDEPENDENT_AMBULATORY_CARE_PROVIDER_SITE_OTHER): Payer: Medicare Other | Admitting: Family Medicine

## 2016-02-02 DIAGNOSIS — M109 Gout, unspecified: Secondary | ICD-10-CM

## 2016-02-02 DIAGNOSIS — R258 Other abnormal involuntary movements: Secondary | ICD-10-CM

## 2016-02-02 DIAGNOSIS — C61 Malignant neoplasm of prostate: Secondary | ICD-10-CM

## 2016-02-02 DIAGNOSIS — I10 Essential (primary) hypertension: Secondary | ICD-10-CM | POA: Diagnosis not present

## 2016-02-02 DIAGNOSIS — E785 Hyperlipidemia, unspecified: Secondary | ICD-10-CM

## 2016-02-02 MED ORDER — SIMVASTATIN 40 MG PO TABS: 40.0000 mg | ORAL_TABLET | Freq: Every day | ORAL | 3 refills | Status: DC

## 2016-02-02 MED ORDER — METOPROLOL SUCCINATE ER 100 MG PO TB24: 100.0000 mg | ORAL_TABLET | Freq: Every day | ORAL | 3 refills | Status: DC

## 2016-02-02 MED ORDER — HYDROCHLOROTHIAZIDE 12.5 MG PO CAPS: 12.5000 mg | ORAL_CAPSULE | ORAL | 3 refills | Status: DC

## 2016-02-02 NOTE — Progress Notes (Signed)
Pre visit review using our clinic review tool, if applicable. No additional management support is needed unless otherwise documented below in the visit note. 

## 2016-02-02 NOTE — Progress Notes (Signed)
Abnormal screenings:  Hearing - failed.  Declined hearing aids.    Bradykinesia, with prev neuro eval done.  Remeron per neurology.  Recently started med.  Sleeping better on medicine.  He has f/u cognitive testing pending, not yet set up.  He had been caring for his mother until she died and that was a noted stressor.  It may be that adjustment related to that strain has affected/caused his bradykinesia.  D/w pt.  remeron seems appropriate at this point.   HCV neg, d/w pt.    Hypertension:  Using medication without problems or lightheadedness: yes Chest pain with exertion:no Edema:no Short of breath:no Prev labs from 10/2015 d/w pt.    Advance directive- wife designated if patient were incapacitated.   PSA prev low.  Usually followed by urology.    Elevated Cholesterol: Using medications without problems:yes Muscle aches: no Diet compliance: encouraged.  Needs better diet adherence.   Exercise: minimal, d/w pt, encouraged.   Lipids not checked recently as it wouldn't change mgmt.  dw pt.   Gout.  Prn colchicine.  Rare flares.  Not on proph med.  When colchicine is needed, it works well.    Meds, vitals, and allergies reviewed.   PMH and SH reviewed  ROS: Per HPI unless specifically indicated in ROS section   GEN: nad, alert and oriented HEENT: mucous membranes moist NECK: supple w/o LA CV: rrr. PULM: ctab, no inc wob ABD: soft, +bs EXT: no edema SKIN: no acute rash Slight slowing of speech noted.  No other focal neuro changes noted on exam.

## 2016-02-02 NOTE — Patient Instructions (Signed)
Don't change your meds for now.  I'll await the follow up notes from Dr. Melrose Nakayama.  Take care.  Glad to see you.  Update me as needed.

## 2016-02-03 NOTE — Assessment & Plan Note (Signed)
Continue statin. >25 minutes spent in face to face time with patient, >50% spent in counselling or coordination of care.

## 2016-02-03 NOTE — Assessment & Plan Note (Signed)
He has neurocognitive testing pending. It may be that adjustment related to the strain of caring for his mother has affected/caused his bradykinesia.  D/w pt.  remeron seems appropriate at this point.  I'll await neuro input.

## 2016-02-03 NOTE — Assessment & Plan Note (Signed)
Controlled, continue as is.  Labs d/w pt.  No change in meds.  

## 2016-02-03 NOTE — Assessment & Plan Note (Signed)
Rare flares, doing well overall.

## 2016-02-03 NOTE — Assessment & Plan Note (Signed)
PSA prev low.  Usually followed by urology.

## 2016-02-27 ENCOUNTER — Other Ambulatory Visit: Payer: Self-pay

## 2016-03-01 DIAGNOSIS — J069 Acute upper respiratory infection, unspecified: Secondary | ICD-10-CM | POA: Diagnosis not present

## 2016-03-10 ENCOUNTER — Encounter: Payer: Self-pay | Admitting: Speech Pathology

## 2016-03-10 ENCOUNTER — Ambulatory Visit: Payer: Medicare Other | Attending: Neurology | Admitting: Speech Pathology

## 2016-03-10 DIAGNOSIS — R471 Dysarthria and anarthria: Secondary | ICD-10-CM

## 2016-03-10 DIAGNOSIS — R41841 Cognitive communication deficit: Secondary | ICD-10-CM | POA: Diagnosis not present

## 2016-03-10 NOTE — Therapy (Signed)
[place] MAIN [facility] SERVICES [address] [place], [place], [address] Phone: [phone]   Fax:  [phone]  Speech Language Pathology Evaluation  Patient Details  Name: Tristan Booker MRN: [id number] Date of Birth: 07-01-1945 Referring Provider: Dr. Melrose Nakayama  Encounter Date: 03/10/2016      End of Session - 03/10/16 1152    Visit Number 1   Number of Visits 17   Date for SLP Re-Evaluation 05/14/16   SLP Start Time 0900   SLP Stop Time  1000   SLP Time Calculation (min) 60 min   Activity Tolerance Patient tolerated treatment well      Past Medical History:  Diagnosis Date  . Gout   . Hyperlipidemia   . Hypertension   . Prostate cancer [facility])     Past Surgical History:  Procedure Laterality Date  . CATARACT EXTRACTION  12/2004   right  . CATARACT EXTRACTION  02/2005   left  . COLONOSCOPY WITH PROPOFOL N/A 02/06/2015   Procedure: COLONOSCOPY WITH PROPOFOL;  Surgeon: Manya Silvas, MD;  Location: [facility] ENDOSCOPY;  Service: Endoscopy;  Laterality: N/A;  . HERNIA REPAIR  1974   left  . PROSTATE BIOPSY  02/27/2002   negative x 6  . PROSTATECTOMY  05/25/09   Dr. Rogers Blocker    There were no vitals filed for this visit.          SLP Evaluation [facility] - 03/10/16 0001      SLP Visit Information   SLP Received On 03/10/16   Referring Provider Dr. Melrose Nakayama   Onset Date 01/27/2016   Medical Diagnosis cognitive impairment     Subjective   Subjective The patient endorses changes in speech, but no significant changes in cognitive function    Patient/Family Stated Goal Maximize function and indepencence     General Information   HPI 70 year old man referred to neurology due to tremor, slowed walking and speech, and some slurring of speech.       Prior Functional Status   Cognitive/Linguistic Baseline Within functional limits  Declining for 6-12 months     Cognition   Overall Cognitive Status Impaired/Different from baseline    Behaviors Other (comment)  reported mood swings, decreased engagement"completely differ     Auditory Comprehension   Overall Auditory Comprehension Appears within functional limits for tasks assessed     Reading Comprehension   Reading Status Within funtional limits     Expression   Primary Mode of Expression Verbal     Verbal Expression   Overall Verbal Expression Appears within functional limits for tasks assessed     Written Expression   Dominant Hand Right   Written Expression Within Functional Limits     Oral Motor/Sensory Function   Overall Oral Motor/Sensory Function Appears within functional limits for tasks assessed     Motor Speech   Overall Motor Speech Impaired   Respiration Impaired   Level of Impairment Conversation   Phonation Low vocal intensity   Resonance Within functional limits   Articulation Within functional limitis  At slowed rate   Intelligibility Intelligible   Phonation Impaired   Tension Present Jaw;Neck;Shoulder   Volume Soft   Pitch Appropriate     Standardized Assessments   Standardized Assessments  Cognitive Linguistic Quick Test;Western Aphasia Battery revised      Cognitive Linguistic Quick Test  The Cognitive Linguistic Quick Test (CLQT) was administered to assess the relative status of five cognitive domains: attention, memory, language, executive  functioning, and visuospatial skills. Scores from 10 tasks were used to estimate severity ratings (for age groups 18-69 years and 70-89 years) for each domain, a clock drawing task, as well as an overall composite severity rating of cognition.   Task    Score  Criterion Cut Scores Personal Facts  8/8   8  Symbol Cancellation    11/12   11 Confrontation Naming    10/10   10 Clock Drawing      11/13  12 Story Retelling       4/10   6 Symbol Trails      3/10   9 Generative Naming      4/9   5 Design Memory  5/6   5 Mazes        0/8   7 Design Generation    4/13   6  Cognitive  Domain  Severity Rating Attention   Mild  Memory   Moderate Executive Function  Severe Language   Mild Visuospatial Skills  Mild Clock Drawing   Mild Composite Severity Rating Moderate    Western Aphasia Battery- Screening  Spontaneous Speech      Information content   10/10       Fluency    10/10     Comprehension     Yes/No questions   9/10          Sequential Commands  10/10    Repetition    10/10     Naming    Object Naming   10/10       Screening Aphasia Quotient  98/100  Reading    8/10 Writing    6/10 Screening Language Quotient  78/100       SLP Education - 03/10/16 1151    Education provided Yes   Education Details Reviewed  preliminary results and recommendations   Person(s) Educated Patient;Spouse   Methods Explanation   Comprehension Verbalized understanding            SLP Long Term Goals - 03/10/16 1155      SLP LONG TERM GOAL #1   Title Patient will identify cognitive barriers and participate in developing functional compensatory strategies.   Time 8   Period Weeks   Status New     SLP LONG TERM GOAL #2   Title Patient will demonstrate functional cognitive-communication skills for independent completion of personal responsibilities.   Time 8   Period Weeks   Status New     SLP LONG TERM GOAL #3   Title Patient will complete attention, executive function skills, and memory strategy activities with 80% accuracy.   Time 8   Period Weeks   Status New     SLP LONG TERM GOAL #4   Title The patient will be independent for relaxation and breath support exercises.   Time 8   Period Weeks   Status New     SLP LONG TERM GOAL #5   Title Pt will improve breath support / control and loudness for short paragraphs using diaphragmatic breathing and appropriate breathing to achieve 80% intelligibility with min effort.   Time 8   Period Weeks   Status New          Plan - 03/10/16 1153    Clinical Impression Statement This 70 year old man, with  neurological findings warranting referral to neurology, is presenting with moderate cognitive communication impairment characterized by impairment of attention, memory, executive function, visuospatial skills, and clock drawing.  The results  of the Cognitive Linguistic Quick Test (CLQT) indicate a composite severity rating of Moderate.  The patient scored with severe deficit of executive function, moderate deficit of memory, and mild impairment of language, visuospatial skills, and clock drawing.  Language screening (Western Aphasia Battery- Screening) shows functional oral and written language output and comprehension.  In addition, the patient demonstrates dysarthria resulting in slowed speech and inefficient breath support for speech.  The patient will benefit from restorative and compensatory treatment of dysarthria and cognitive communication deficits. The patient would benefit from further cognitive testing with neuropsychology and referral to neuro-ophthalmology.    Speech Therapy Frequency 2x / week   Duration Other (comment)  8 weeks   Potential to Achieve Goals Good   Potential Considerations Ability to learn/carryover information;Co-morbidities;Cooperation/participation level;Medical prognosis;Pain level;Previous level of function;Severity of impairments;Family/community support   SLP Home Exercise Plan To be determined   Consulted and Agree with Plan of Care Patient;Family member/caregiver   Family Member Consulted Spouse      Patient will benefit from skilled therapeutic intervention in order to improve the following deficits and impairments:   Cognitive communication deficit - Plan: SLP plan of care cert/re-cert  Dysarthria - Plan: SLP plan of care cert/re-cert      G-Codes - [id number]    Functional Assessment Tool Used clinical judgment, WAB- R, CLQT   Functional Limitations Attention   Attention Current Status [id number]) At least 40 percent but less than 60 percent impaired,  limited or restricted   Attention Goal Status [id number]) At least 20 percent but less than 40 percent impaired, limited or restricted      Problem List Patient Active Problem List   Diagnosis Date Noted  . Bradykinesia 10/29/2015  . Advance care planning 01/29/2015  . Medicare annual wellness visit, subsequent 12/29/2011  . Multiple adenomatous polyps 03/17/2011  . Diverticulosis of sigmoid colon 03/17/2011  . Prostate cancer ([place]) 01/20/2011  . 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 SHOULDER PAIN, LEFT 05/23/2007  . Hyperlipidemia 11/09/2006  . Gout 11/09/2006  . Essential hypertension 11/09/2006    [name] 03/10/2016, 12:04 PM  [place] MAIN [facility] SERVICES [address] [place], [place], [address] Phone: [phone]   Fax:  [phone]  Name: Tristan Booker MRN: [id number] Date of Birth: 1946/01/09

## 2016-03-16 ENCOUNTER — Ambulatory Visit: Payer: Medicare Other | Admitting: Speech Pathology

## 2016-03-16 ENCOUNTER — Encounter: Payer: Self-pay | Admitting: Speech Pathology

## 2016-03-16 DIAGNOSIS — R471 Dysarthria and anarthria: Secondary | ICD-10-CM | POA: Diagnosis not present

## 2016-03-16 DIAGNOSIS — R41841 Cognitive communication deficit: Secondary | ICD-10-CM | POA: Diagnosis not present

## 2016-03-16 NOTE — Therapy (Signed)
[place] MAIN [facility] SERVICES [address] [place], [place], [address] Phone: [phone]   Fax:  [phone]  Speech Language Pathology Treatment  Patient Details  Name: Tristan Booker MRN: [id number] Date of Birth: [birth year] Referring Provider: Dr. Melrose Nakayama  Encounter Date: 03/16/2016      End of Session - 03/16/16 1345    Visit Number 2   Number of Visits 17   Date for SLP Re-Evaluation 05/14/16   SLP Start Time 1000   SLP Stop Time  1055   SLP Time Calculation (min) 55 min   Activity Tolerance Patient tolerated treatment well      Past Medical History:  Diagnosis Date  . Gout   . Hyperlipidemia   . Hypertension   . Prostate cancer [facility])     Past Surgical History:  Procedure Laterality Date  . CATARACT EXTRACTION  12/2004   right  . CATARACT EXTRACTION  02/2005   left  . COLONOSCOPY WITH PROPOFOL N/A 02/06/2015   Procedure: COLONOSCOPY WITH PROPOFOL;  Surgeon: Manya Silvas, MD;  Location: [facility] ENDOSCOPY;  Service: Endoscopy;  Laterality: N/A;  . HERNIA REPAIR  1974   left  . PROSTATE BIOPSY  02/27/2002   negative x 6  . PROSTATECTOMY  05/25/09   Dr. Rogers Blocker    There were no vitals filed for this visit.      Subjective Assessment - 03/16/16 1343    Subjective "I can't see well, these glasses have never been right"   Currently in Pain? No/denies               ADULT SLP TREATMENT - 03/16/16 0001      General Information   Behavior/Cognition Alert;Cooperative;Pleasant mood     Treatment Provided   Treatment provided Cognitive-Linquistic     Pain Assessment   Pain Assessment No/denies pain     Cognitive-Linquistic Treatment   Treatment focused on Cognition   Skilled Treatment ATTENTION: complete trail making task with 4 cues.  Follow set of 5 instructions to create line drawing with 95% accuracy.   MEMORY: draw geometric shape associated with line drawing with 50% accuracy; patient not able to create a  method to remember (verbal or visual).  Remember pictured details in complex picture with 70% accuracy.  State 4 of 5 pictured people and associated pictured object.  PATIENT/FAMILY EDUCATION: sent home questionnaire for wife to complete to determine specific cognitive barriers experienced by the patient.     Assessment / Recommendations / Plan   Plan Continue with current plan of care     Progression Toward Goals   Progression toward goals Progressing toward goals          SLP Education - 03/16/16 1344    Education provided Yes   Education Details Family questionaire, recommend referral to neuropsychology and neuro opthamology   Person(s) Educated Patient;Spouse   Methods Explanation   Comprehension Verbalized understanding            SLP Long Term Goals - 03/10/16 1155      SLP LONG TERM GOAL #1   Title Patient will identify cognitive barriers and participate in developing functional compensatory strategies.   Time 8   Period Weeks   Status New     SLP LONG TERM GOAL #2   Title Patient will demonstrate functional cognitive-communication skills for independent completion of personal responsibilities.   Time 8   Period Weeks   Status New     SLP LONG  TERM GOAL #3   Title Patient will complete attention, executive function skills, and memory strategy activities with 80% accuracy.   Time 8   Period Weeks   Status New     SLP LONG TERM GOAL #4   Title The patient will be independent for relaxation and breath support exercises.   Time 8   Period Weeks   Status New     SLP LONG TERM GOAL #5   Title Pt will improve breath support / control and loudness for short paragraphs using diaphragmatic breathing and appropriate breathing to achieve 80% intelligibility with min effort.   Time 8   Period Weeks   Status New          Plan - 03/16/16 1346    Clinical Impression Statement The patient is able to complete simple, multi-step tasks with good accuracy.  The patient  is complaining of visual problems and attributes all his cognitive difficulties to his vision.  The patient's wife will complete a cognitive functional impact questionnaire.  The patient's speech was better today.      Patient will benefit from skilled therapeutic intervention in order to improve the following deficits and impairments:   Cognitive communication deficit  Dysarthria    Problem List Patient Active Problem List   Diagnosis Date Noted  . Bradykinesia 10/29/2015  . Advance care planning 01/29/2015  . Medicare annual wellness visit, subsequent 12/29/2011  . Multiple adenomatous polyps 03/17/2011  . Diverticulosis of sigmoid colon 03/17/2011  . Prostate cancer ([place]) 01/20/2011  . 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 SHOULDER PAIN, LEFT 05/23/2007  . Hyperlipidemia 11/09/2006  . Gout 11/09/2006  . Essential hypertension 11/09/2006   Leroy Sea, MS/CCC- SLP  [name] 03/16/2016, 1:47 PM  [place] MAIN [facility] SERVICES [address] [place], [place], [address] Phone: [phone]   Fax:  [phone]   Name: Tristan Booker MRN: [id number] Date of Birth: May 30, 1945

## 2016-03-19 ENCOUNTER — Encounter: Payer: Self-pay | Admitting: Speech Pathology

## 2016-03-19 ENCOUNTER — Ambulatory Visit: Payer: Medicare Other | Admitting: Speech Pathology

## 2016-03-19 DIAGNOSIS — R471 Dysarthria and anarthria: Secondary | ICD-10-CM

## 2016-03-19 DIAGNOSIS — R41841 Cognitive communication deficit: Secondary | ICD-10-CM

## 2016-03-19 NOTE — Therapy (Signed)
[place] MAIN [facility] SERVICES [address] [place], [place], [address] Phone: [phone]   Fax:  [phone]  Speech Language Pathology Treatment  Patient Details  Name: Tristan Booker MRN: [id number] Date of Birth: 1946-03-22 Referring Provider: Dr. Melrose Nakayama  Encounter Date: 03/19/2016      End of Session - 03/19/16 1335    Visit Number 3   Number of Visits 17   Date for SLP Re-Evaluation 05/14/16   SLP Start Time 1000   SLP Stop Time  1100   SLP Time Calculation (min) 60 min   Activity Tolerance Patient tolerated treatment well      Past Medical History:  Diagnosis Date  . Gout   . Hyperlipidemia   . Hypertension   . Prostate cancer [facility])     Past Surgical History:  Procedure Laterality Date  . CATARACT EXTRACTION  12/2004   right  . CATARACT EXTRACTION  02/2005   left  . COLONOSCOPY WITH PROPOFOL N/A 02/06/2015   Procedure: COLONOSCOPY WITH PROPOFOL;  Surgeon: Manya Silvas, MD;  Location: [facility] ENDOSCOPY;  Service: Endoscopy;  Laterality: N/A;  . HERNIA REPAIR  1974   left  . PROSTATE BIOPSY  02/27/2002   negative x 6  . PROSTATECTOMY  05/25/09   Dr. Rogers Blocker    There were no vitals filed for this visit.      Subjective Assessment - 03/19/16 1334    Subjective Patient denies cognitive/behavioral changes are indicative of anything other than aging.   Currently in Pain? No/denies               ADULT SLP TREATMENT - 03/19/16 0001      General Information   Behavior/Cognition Alert;Cooperative;Pleasant mood     Treatment Provided   Treatment provided Cognitive-Linquistic     Pain Assessment   Pain Assessment No/denies pain     Cognitive-Linquistic Treatment   Treatment focused on Cognition   Skilled Treatment ATTENTION: complete trail making task with 1 cues.  Complete syllogism worksheets with 70% accuracy independently and 85% with min cues ("no that's not right") PATIENT/FAMILY EDUCATION: The patient  and his wife returned a cognitive barriers questionnaire.  The patient and his wife both report vision problems as a barrier to resuming his usual activities.  The patient's wife also identifies mood changes, fatigue/disinterest, and slowed processing.  The patient denies these as significant.     Assessment / Recommendations / Plan   Plan Continue with current plan of care     Progression Toward Goals   Progression toward goals Progressing toward goals          SLP Education - 03/19/16 1334    Education provided Yes   Education Details recommend referral to neuropsychology and neuro opthamology and assessment for depression   Person(s) Educated Patient;Spouse   Methods Explanation   Comprehension Verbalized understanding            SLP Long Term Goals - 03/10/16 1155      SLP LONG TERM GOAL #1   Title Patient will identify cognitive barriers and participate in developing functional compensatory strategies.   Time 8   Period Weeks   Status New     SLP LONG TERM GOAL #2   Title Patient will demonstrate functional cognitive-communication skills for independent completion of personal responsibilities.   Time 8   Period Weeks   Status New     SLP LONG TERM GOAL #3   Title Patient will complete  attention, executive function skills, and memory strategy activities with 80% accuracy.   Time 8   Period Weeks   Status New     SLP LONG TERM GOAL #4   Title The patient will be independent for relaxation and breath support exercises.   Time 8   Period Weeks   Status New     SLP LONG TERM GOAL #5   Title Pt will improve breath support / control and loudness for short paragraphs using diaphragmatic breathing and appropriate breathing to achieve 80% intelligibility with min effort.   Time 8   Period Weeks   Status New          Plan - 03/19/16 1335    Clinical Impression Statement The patient is able to complete simple, multi-step tasks with good accuracy.  The patient is  complaining of visual problems and attributes all his cognitive difficulties to his vision.  The patient's wife has completed a cognitive functional impact questionnaire.  The patient's speech was better today.   Speech Therapy Frequency 2x / week   Duration Other (comment)   Treatment/Interventions SLP instruction and feedback;Cognitive reorganization;Internal/external aids;Compensatory strategies;Patient/family education   Potential to Achieve Goals Good   Potential Considerations Ability to learn/carryover information;Co-morbidities;Cooperation/participation level;Medical prognosis;Pain level;Previous level of function;Severity of impairments;Family/community support   Consulted and Agree with Plan of Care Patient;Family member/caregiver   Family Member Consulted Spouse      Patient will benefit from skilled therapeutic intervention in order to improve the following deficits and impairments:   Cognitive communication deficit  Dysarthria    Problem List Patient Active Problem List   Diagnosis Date Noted  . Bradykinesia 10/29/2015  . Advance care planning 01/29/2015  . Medicare annual wellness visit, subsequent 12/29/2011  . Multiple adenomatous polyps 03/17/2011  . Diverticulosis of sigmoid colon 03/17/2011  . Prostate cancer ([place]) 01/20/2011  . 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 SHOULDER PAIN, LEFT 05/23/2007  . Hyperlipidemia 11/09/2006  . Gout 11/09/2006  . Essential hypertension 11/09/2006   Leroy Sea, MS/CCC- SLP  [name] 03/19/2016, 1:37 PM  [place] MAIN [facility] SERVICES [address] [place], [place], [address] Phone: [phone]   Fax:  [phone]   Name: Tristan Booker MRN: [id number] Date of Birth: 09-14-1945

## 2016-03-24 ENCOUNTER — Encounter: Payer: Medicare Other | Admitting: Speech Pathology

## 2016-03-26 ENCOUNTER — Encounter: Payer: Medicare Other | Admitting: Speech Pathology

## 2016-03-30 ENCOUNTER — Encounter: Payer: Medicare Other | Admitting: Speech Pathology

## 2016-04-01 ENCOUNTER — Encounter: Payer: Medicare Other | Admitting: Speech Pathology

## 2016-04-05 DIAGNOSIS — R251 Tremor, unspecified: Secondary | ICD-10-CM | POA: Diagnosis not present

## 2016-04-05 DIAGNOSIS — H538 Other visual disturbances: Secondary | ICD-10-CM | POA: Diagnosis not present

## 2016-04-05 DIAGNOSIS — G621 Alcoholic polyneuropathy: Secondary | ICD-10-CM | POA: Diagnosis not present

## 2016-04-05 DIAGNOSIS — F688 Other specified disorders of adult personality and behavior: Secondary | ICD-10-CM | POA: Diagnosis not present

## 2016-04-06 ENCOUNTER — Encounter: Payer: Medicare Other | Admitting: Speech Pathology

## 2016-04-08 ENCOUNTER — Encounter: Payer: Medicare Other | Admitting: Speech Pathology

## 2016-04-13 ENCOUNTER — Encounter: Payer: Medicare Other | Admitting: Speech Pathology

## 2016-04-15 ENCOUNTER — Encounter: Payer: Medicare Other | Admitting: Speech Pathology

## 2016-04-20 ENCOUNTER — Encounter: Payer: Medicare Other | Admitting: Speech Pathology

## 2016-04-22 ENCOUNTER — Encounter: Payer: Medicare Other | Admitting: Speech Pathology

## 2016-05-03 DIAGNOSIS — R251 Tremor, unspecified: Secondary | ICD-10-CM | POA: Diagnosis not present

## 2016-05-03 DIAGNOSIS — F688 Other specified disorders of adult personality and behavior: Secondary | ICD-10-CM | POA: Diagnosis not present

## 2016-05-03 DIAGNOSIS — G621 Alcoholic polyneuropathy: Secondary | ICD-10-CM | POA: Diagnosis not present

## 2016-05-04 DIAGNOSIS — H02533 Eyelid retraction right eye, unspecified eyelid: Secondary | ICD-10-CM | POA: Diagnosis not present

## 2016-05-04 DIAGNOSIS — H538 Other visual disturbances: Secondary | ICD-10-CM | POA: Diagnosis not present

## 2016-05-17 DIAGNOSIS — F4323 Adjustment disorder with mixed anxiety and depressed mood: Secondary | ICD-10-CM | POA: Diagnosis not present

## 2016-05-17 DIAGNOSIS — F6381 Intermittent explosive disorder: Secondary | ICD-10-CM | POA: Diagnosis not present

## 2016-05-31 DIAGNOSIS — L853 Xerosis cutis: Secondary | ICD-10-CM | POA: Diagnosis not present

## 2016-05-31 DIAGNOSIS — L918 Other hypertrophic disorders of the skin: Secondary | ICD-10-CM | POA: Diagnosis not present

## 2016-05-31 DIAGNOSIS — L219 Seborrheic dermatitis, unspecified: Secondary | ICD-10-CM | POA: Diagnosis not present

## 2016-05-31 DIAGNOSIS — L821 Other seborrheic keratosis: Secondary | ICD-10-CM | POA: Diagnosis not present

## 2016-05-31 DIAGNOSIS — L718 Other rosacea: Secondary | ICD-10-CM | POA: Diagnosis not present

## 2016-05-31 DIAGNOSIS — L719 Rosacea, unspecified: Secondary | ICD-10-CM | POA: Diagnosis not present

## 2016-06-15 DIAGNOSIS — G629 Polyneuropathy, unspecified: Secondary | ICD-10-CM | POA: Diagnosis not present

## 2016-06-15 DIAGNOSIS — R251 Tremor, unspecified: Secondary | ICD-10-CM | POA: Diagnosis not present

## 2016-08-03 DIAGNOSIS — C61 Malignant neoplasm of prostate: Secondary | ICD-10-CM | POA: Diagnosis not present

## 2016-08-03 DIAGNOSIS — D400 Neoplasm of uncertain behavior of prostate: Secondary | ICD-10-CM | POA: Diagnosis not present

## 2016-08-03 DIAGNOSIS — N5201 Erectile dysfunction due to arterial insufficiency: Secondary | ICD-10-CM | POA: Diagnosis not present

## 2016-12-15 DIAGNOSIS — H538 Other visual disturbances: Secondary | ICD-10-CM | POA: Diagnosis not present

## 2016-12-15 DIAGNOSIS — I10 Essential (primary) hypertension: Secondary | ICD-10-CM | POA: Diagnosis not present

## 2016-12-15 DIAGNOSIS — G20 Parkinson's disease: Secondary | ICD-10-CM | POA: Diagnosis not present

## 2017-02-02 ENCOUNTER — Ambulatory Visit (INDEPENDENT_AMBULATORY_CARE_PROVIDER_SITE_OTHER): Payer: Medicare Other

## 2017-02-02 ENCOUNTER — Other Ambulatory Visit: Payer: Self-pay | Admitting: Family Medicine

## 2017-02-02 VITALS — BP 132/80 | HR 68 | Temp 97.4°F | Ht 68.0 in | Wt 221.8 lb

## 2017-02-02 DIAGNOSIS — C61 Malignant neoplasm of prostate: Secondary | ICD-10-CM | POA: Diagnosis not present

## 2017-02-02 DIAGNOSIS — Z0000 Encounter for general adult medical examination without abnormal findings: Secondary | ICD-10-CM

## 2017-02-02 DIAGNOSIS — Z23 Encounter for immunization: Secondary | ICD-10-CM | POA: Diagnosis not present

## 2017-02-02 DIAGNOSIS — M109 Gout, unspecified: Secondary | ICD-10-CM | POA: Diagnosis not present

## 2017-02-02 DIAGNOSIS — I10 Essential (primary) hypertension: Secondary | ICD-10-CM

## 2017-02-02 DIAGNOSIS — Z125 Encounter for screening for malignant neoplasm of prostate: Secondary | ICD-10-CM

## 2017-02-02 LAB — LIPID PANEL
Cholesterol: 155 mg/dL (ref 0–200)
HDL: 42.8 mg/dL (ref >39.00)
LDL Cholesterol: 89 mg/dL (ref 0–99)
NonHDL: 111.98
Total CHOL/HDL Ratio: 4
Triglycerides: 114 mg/dL (ref 0.0–149.0)
VLDL: 22.8 mg/dL (ref 0.0–40.0)

## 2017-02-02 LAB — COMPREHENSIVE METABOLIC PANEL
ALT: 4 U/L (ref 0–53)
AST: 15 U/L (ref 0–37)
Albumin: 4.4 g/dL (ref 3.5–5.2)
Alkaline Phosphatase: 99 U/L (ref 39–117)
BUN: 11 mg/dL (ref 6–23)
CO2: 31 mEq/L (ref 19–32)
Calcium: 9.7 mg/dL (ref 8.4–10.5)
Chloride: 98 mEq/L (ref 96–112)
Creatinine, Ser: 1.05 mg/dL (ref 0.40–1.50)
GFR: 74.39 mL/min (ref >60.00)
Glucose, Bld: 110 mg/dL — ABNORMAL HIGH (ref 70–99)
Potassium: 4.7 mEq/L (ref 3.5–5.1)
Sodium: 136 mEq/L (ref 135–145)
Total Bilirubin: 0.7 mg/dL (ref 0.2–1.2)
Total Protein: 7 g/dL (ref 6.0–8.3)

## 2017-02-02 LAB — PSA, MEDICARE: PSA: 0.14 ng/ml (ref 0.10–4.00)

## 2017-02-02 LAB — URIC ACID: Uric Acid, Serum: 7.8 mg/dL (ref 4.0–7.8)

## 2017-02-02 NOTE — Progress Notes (Signed)
Subjective:   Tristan Booker is a 71 y.o. male who presents for Medicare Annual/Subsequent preventive examination.  Review of Systems:  N/A Cardiac Risk Factors include: advanced age (>51men, >51 women);male gender;obesity (BMI >30kg/m2);dyslipidemia;hypertension     Objective:    Vitals: BP 132/80 (BP Location: Right Arm, Patient Position: Sitting, Cuff Size: Normal)   Pulse 68   Temp (!) 97.4 F (36.3 C) (Oral)   Ht 5\' 8"  (1.727 m) Comment: no shoes  Wt 221 lb 12 oz (100.6 kg)   SpO2 96%   BMI 33.72 kg/m   Body mass index is 33.72 kg/m.  Tobacco Social History   Tobacco Use  Smoking Status Never Smoker  Smokeless Tobacco Former Engineer, structural given: No   Past Medical History:  Diagnosis Date  . Gout   . Hyperlipidemia   . Hypertension   . Parkinson disease ([place])   . Prostate cancer [facility])    Past Surgical History:  Procedure Laterality Date  . CATARACT EXTRACTION  12/2004   right  . CATARACT EXTRACTION  02/2005   left  . HERNIA REPAIR  1974   left  . PROSTATE BIOPSY  02/27/2002   negative x 6  . PROSTATECTOMY  05/25/09   Dr. Rogers Blocker   Family History  Problem Relation Age of Onset  . Cancer Mother        breast, s/p mastectomy 1970's  . Heart failure Mother   . Dementia Mother   . Emphysema Father        smoker  . COPD Father        Past smoker  . Heart failure Father   . Arthritis Brother   . Colon cancer Neg Hx    Social History   Substance and Sexual Activity  Sexual Activity Yes    Outpatient Encounter Medications as of 02/02/2017  Medication Sig  . aspirin 81 MG tablet Take 81 mg by mouth daily.    . colchicine 0.6 MG tablet TAKE ONE TABLET BY MOUTH ONCE DAILY AS NEEDED FOR  GOUT  PAIN  . docusate sodium (COLACE) 100 MG capsule Take 100 mg by mouth daily as needed.   . hydrochlorothiazide (MICROZIDE) 12.5 MG capsule Take 1 capsule (12.5 mg total) by mouth every morning.  Marland Kitchen lisinopril (PRINIVIL,ZESTRIL) 5 MG tablet Take by mouth.    . metoprolol succinate (TOPROL XL) 100 MG 24 hr tablet Take 1 tablet (100 mg total) by mouth daily. Take with or immediately following a meal.  . ranitidine (ZANTAC) 75 MG tablet As needed.   . simvastatin (ZOCOR) 40 MG tablet Take 1 tablet (40 mg total) by mouth at bedtime.  . [DISCONTINUED] mirtazapine (REMERON) 15 MG tablet Take 1 tablet by mouth daily.   No facility-administered encounter medications on file as of 02/02/2017.     Activities of Daily Living In your present state of health, do you have any difficulty performing the following activities: 02/02/2017  Hearing? N  Vision? Y  Comment loss of peripheral vision   Difficulty concentrating or making decisions? Y  Walking or climbing stairs? N  Dressing or bathing? N  Doing errands, shopping? N  Preparing Food and eating ? N  Using the Toilet? N  In the past six months, have you accidently leaked urine? N  Do you have problems with loss of bowel control? N  Managing your Medications? N  Managing your Finances? N  Housekeeping or managing your Housekeeping? N  Some recent data might  be hidden    Patient Care Team: Tonia Ghent, MD as PCP - General (Family Medicine) Dingeldein, Remo Lipps, MD as Consulting Physician (Ophthalmology)   Assessment:     Hearing Screening   125Hz  250Hz  500Hz  1000Hz  2000Hz  3000Hz  4000Hz  6000Hz  8000Hz   Right ear:   40 40 40  0    Left ear:   40 40 40  0    Vision Screening Comments: Last vision exam in May 2018 with Dr. Sandra Cockayne   Exercise Activities and Dietary recommendations Current Exercise Habits: Home exercise routine, Type of exercise: walking, Time (Minutes): 30, Frequency (Times/Week): 7, Weekly Exercise (Minutes/Week): 210, Intensity: Mild, Exercise limited by: None identified  Goals    Starting 02/02/2017, I will continue to walk at least 1 mile daily.     Fall Risk Fall Risk  02/02/2017 02/27/2016 01/29/2016 01/27/2015 01/01/2013  Falls in the past year? No No No No No   Comment - Emmi Telephone Survey: data to providers prior to load - - -   Depression Screen PHQ 2/9 Scores 02/02/2017 01/29/2016 01/27/2015 01/01/2013  PHQ - 2 Score 0 0 0 0  PHQ- 9 Score 0 - - -    Cognitive Function MMSE - Mini Mental State Exam 02/02/2017 01/29/2016  Orientation to time 5 5  Orientation to Place 5 5  Registration 3 3  Attention/ Calculation 0 0  Recall 3 3  Language- name 2 objects 0 0  Language- repeat 1 1  Language- follow 3 step command 3 3  Language- read & follow direction 0 0  Write a sentence 0 0  Copy design 0 0  Total score 20 20     PLEASE NOTE: A Mini-Cog screen was completed. Maximum score is 20. A value of 0 denotes this part of Folstein MMSE was not completed or the patient failed this part of the Mini-Cog screening.   Mini-Cog Screening Orientation to Time - Max 5 pts Orientation to Place - Max 5 pts Registration - Max 3 pts Recall - Max 3 pts Language Repeat - Max 1 pts Language Follow 3 Step Command - Max 3 pts     Immunization History  Administered Date(s) Administered  . Influenza Split 01/20/2011, 12/28/2011  . Influenza Whole 12/31/2005, 01/14/2010  . Influenza, Seasonal, Injecte, Preservative Fre 12/05/2015  . Influenza,inj,Quad PF,6+ Mos 01/01/2013, 02/02/2017  . Influenza-Unspecified 01/22/2015  . Pneumococcal Conjugate-13 01/29/2016  . Pneumococcal Polysaccharide-23 02/02/2017  . Td 09/19/1996, 11/30/2007  . Zoster 02/21/2013   Screening Tests Health Maintenance  Topic Date Due  . TETANUS/TDAP  11/29/2017  . COLONOSCOPY  02/06/2020  . INFLUENZA VACCINE  Completed  . Hepatitis C Screening  Completed  . PNA vac Low Risk Adult  Completed      Plan:     I have personally reviewed, addressed, and noted the following in the patient's chart:  A. Medical and social history B. Use of alcohol, tobacco or illicit drugs  C. Current medications and supplements D. Functional ability and status E.  Nutritional status F.   Physical activity G. Advance directives H. List of other physicians I.  Hospitalizations, surgeries, and ER visits in previous 12 months J.  [place] to include hearing, vision, cognitive, depression L. Referrals and appointments - none  In addition, I have reviewed and discussed with patient certain preventive protocols, quality metrics, and best practice recommendations. A written personalized care plan for preventive services as well as general preventive health recommendations were provided to patient.  See  attached scanned questionnaire for additional information.   Signed,   Lindell Noe, MHA, BS, LPN Health Coach

## 2017-02-02 NOTE — Progress Notes (Signed)
   Subjective:    Patient ID: Tristan Booker, male    DOB: [birth year], 71 y.o.   MRN: [id number]  HPI I reviewed health advisor's note, was available for consultation, and agree with documentation and plan.    Review of Systems     Objective:   Physical Exam        Assessment & Plan:

## 2017-02-02 NOTE — Patient Instructions (Signed)
Mr. Ballow , Thank you for taking time to come for your Medicare Wellness Visit. I appreciate your ongoing commitment to your health goals. Please review the following plan we discussed and let me know if I can assist you in the future.   These are the goals we discussed: Goals    Starting 02/02/2017, I will continue to walk at least 1 mile daily.       This is a list of the screening recommended for you and due dates:  Health Maintenance  Topic Date Due  . Tetanus Vaccine  11/29/2017  . Colon Cancer Screening  02/06/2020  . Flu Shot  Completed  .  Hepatitis C: One time screening is recommended by Center for Disease Control  (CDC) for  adults born from [birth year] through 1963.   Completed  . Pneumonia vaccines  Completed   Preventive Care for Adults  A healthy lifestyle and preventive care can promote health and wellness. Preventive health guidelines for adults include the following key practices.  . A routine yearly physical is a good way to check with your health care provider about your health and preventive screening. It is a chance to share any concerns and updates on your health and to receive a thorough exam.  . Visit your dentist for a routine exam and preventive care every 6 months. Brush your teeth twice a day and floss once a day. Good oral hygiene prevents tooth decay and gum disease.  . The frequency of eye exams is based on your age, health, family medical history, use  of contact lenses, and other factors. Follow your health care provider's recommendations for frequency of eye exams.  . Eat a healthy diet. Foods like vegetables, fruits, whole grains, low-fat dairy products, and lean protein foods contain the nutrients you need without too many calories. Decrease your intake of foods high in solid fats, added sugars, and salt. Eat the right amount of calories for you. Get information about a proper diet from your health care provider, if necessary.  . Regular physical exercise  is one of the most important things you can do for your health. Most adults should get at least 150 minutes of moderate-intensity exercise (any activity that increases your heart rate and causes you to sweat) each week. In addition, most adults need muscle-strengthening exercises on 2 or more days a week.  Silver Sneakers may be a benefit available to you. To determine eligibility, you may visit the website: www.silversneakers.com or contact program at (906)607-6458 Mon-Fri between 8AM-8PM.   . Maintain a healthy weight. The body mass index (BMI) is a screening tool to identify possible weight problems. It provides an estimate of body fat based on height and weight. Your health care provider can find your BMI and can help you achieve or maintain a healthy weight.   For adults 20 years and older: ? A BMI below 18.5 is considered underweight. ? A BMI of 18.5 to 24.9 is normal. ? A BMI of 25 to 29.9 is considered overweight. ? A BMI of 30 and above is considered obese.   . Maintain normal blood lipids and cholesterol levels by exercising and minimizing your intake of saturated fat. Eat a balanced diet with plenty of fruit and vegetables. Blood tests for lipids and cholesterol should begin at age 54 and be repeated every 5 years. If your lipid or cholesterol levels are high, you are over 50, or you are at high risk for heart disease, you may need your  cholesterol levels checked more frequently. Ongoing high lipid and cholesterol levels should be treated with medicines if diet and exercise are not working.  . If you smoke, find out from your health care provider how to quit. If you do not use tobacco, please do not start.  . If you choose to drink alcohol, please do not consume more than 2 drinks per day. One drink is considered to be 12 ounces (355 mL) of beer, 5 ounces (148 mL) of wine, or 1.5 ounces (44 mL) of liquor.  . If you are 14-71 years old, ask your health care provider if you should take  aspirin to prevent strokes.  . Use sunscreen. Apply sunscreen liberally and repeatedly throughout the day. You should seek shade when your shadow is shorter than you. Protect yourself by wearing long sleeves, pants, a wide-brimmed hat, and sunglasses year round, whenever you are outdoors.  . Once a month, do a whole body skin exam, using a mirror to look at the skin on your back. Tell your health care provider of new moles, moles that have irregular borders, moles that are larger than a pencil eraser, or moles that have changed in shape or color.

## 2017-02-02 NOTE — Progress Notes (Signed)
PCP notes:   Health maintenance:  Flu vaccine - administered PPSV23 - administered  Abnormal screenings:   Hearing - failed  Hearing Screening   125Hz  250Hz  500Hz  1000Hz  2000Hz  3000Hz  4000Hz  6000Hz  8000Hz   Right ear:   40 40 40  0    Left ear:   40 40 40  0     Patient concerns:   Patient was recently diagnosed with Parkinson's disease by Dr. Melrose Nakayama @ [facility]. Patient verbalized he did not receive any patient education regarding diagnosis. Patient was provided patient education regarding Parkinson's from the [facility] and was encouraged to discuss concerns with PCP at next visit.  Nurse concerns:  None  Next PCP appt:   02/09/17 @ 1415

## 2017-02-02 NOTE — Progress Notes (Signed)
Pre visit review using our clinic review tool, if applicable. No additional management support is needed unless otherwise documented below in the visit note. 

## 2017-02-09 ENCOUNTER — Encounter: Payer: Medicare Other | Admitting: Family Medicine

## 2017-02-16 ENCOUNTER — Encounter: Payer: Self-pay | Admitting: Family Medicine

## 2017-02-16 ENCOUNTER — Ambulatory Visit (INDEPENDENT_AMBULATORY_CARE_PROVIDER_SITE_OTHER): Payer: Medicare Other | Admitting: Family Medicine

## 2017-02-16 VITALS — BP 136/70 | HR 65 | Temp 98.5°F | Wt 222.5 lb

## 2017-02-16 DIAGNOSIS — Z7189 Other specified counseling: Secondary | ICD-10-CM

## 2017-02-16 DIAGNOSIS — M109 Gout, unspecified: Secondary | ICD-10-CM | POA: Diagnosis not present

## 2017-02-16 DIAGNOSIS — C61 Malignant neoplasm of prostate: Secondary | ICD-10-CM

## 2017-02-16 DIAGNOSIS — I10 Essential (primary) hypertension: Secondary | ICD-10-CM

## 2017-02-16 DIAGNOSIS — Z0000 Encounter for general adult medical examination without abnormal findings: Secondary | ICD-10-CM | POA: Diagnosis not present

## 2017-02-16 DIAGNOSIS — G20 Parkinson's disease: Secondary | ICD-10-CM

## 2017-02-16 DIAGNOSIS — E785 Hyperlipidemia, unspecified: Secondary | ICD-10-CM | POA: Diagnosis not present

## 2017-02-16 MED ORDER — HYDROCHLOROTHIAZIDE 12.5 MG PO CAPS: 12.5000 mg | ORAL_CAPSULE | ORAL | 3 refills | Status: DC

## 2017-02-16 MED ORDER — COLCHICINE 0.6 MG PO TABS: ORAL_TABLET | ORAL | 11 refills | Status: DC

## 2017-02-16 MED ORDER — METOPROLOL SUCCINATE ER 100 MG PO TB24: 100.0000 mg | ORAL_TABLET | Freq: Every day | ORAL | 3 refills | Status: DC

## 2017-02-16 MED ORDER — SIMVASTATIN 40 MG PO TABS: 40.0000 mg | ORAL_TABLET | Freq: Every day | ORAL | 3 refills | Status: DC

## 2017-02-16 MED ORDER — LISINOPRIL 5 MG PO TABS: 5.0000 mg | ORAL_TABLET | Freq: Every day | ORAL | 3 refills | Status: DC

## 2017-02-16 NOTE — Progress Notes (Signed)
Health maintenance: Flu vaccine - administered PPSV23 - administered Hearing - failed.  Declined hearing aids.   Colonoscopy 2016.   Advance directive- wife designated if patient were incapacitated.   Patient was diagnosed with Parkinson's disease by Dr. Melrose Nakayama @ [facility]. D/w pt.  On tx now.  Salivation, tremor, gait changes noted.  Prev with some flattening of affect noted.  D/w pt about path/phys and treatment in general.    Elevated Cholesterol: Using medications without problems:yes Muscle aches: no Diet compliance: encouraged.   Exercise:encoruaged.   Uric acid <8, no recent flares of gout. Using colchicine prn, but not often.    Hypertension:    Using medication without problems or lightheadedness: yes ex occ lightheaded- mild sx.   Chest pain with exertion:no Edema:no Short of breath:no  Meds, vitals, and allergies reviewed.   PMH and SH reviewed  ROS: Per HPI unless specifically indicated in ROS section   GEN: nad, alert and oriented, flat affect noted.  HEENT: mucous membranes moist NECK: supple w/o LA CV: rrr. PULM: ctab, no inc wob ABD: soft, +bs, soft umbilical hernia noted.   EXT: no edema SKIN: no acute rash occ faint tremor noted.

## 2017-02-16 NOTE — Patient Instructions (Addendum)
If you have more trouble with lightheadedness, then stop HCTZ and let me know.  We may need to trim the BP meds.   Update me as needed.  Take care.  Glad to see you.

## 2017-02-17 ENCOUNTER — Telehealth: Payer: Self-pay | Admitting: Family Medicine

## 2017-02-17 DIAGNOSIS — Z0000 Encounter for general adult medical examination without abnormal findings: Secondary | ICD-10-CM | POA: Insufficient documentation

## 2017-02-17 DIAGNOSIS — G20 Parkinson's disease: Secondary | ICD-10-CM | POA: Insufficient documentation

## 2017-02-17 DIAGNOSIS — G20A1 Parkinson's disease without dyskinesia, without mention of fluctuations: Secondary | ICD-10-CM | POA: Insufficient documentation

## 2017-02-17 NOTE — Assessment & Plan Note (Signed)
Advance directive- wife designated if patient were incapacitated.  

## 2017-02-17 NOTE — Assessment & Plan Note (Signed)
Per neuro.  Salivation, tremor, gait changes noted.  Prev with some flattening of affect noted.  D/w pt about path/phys and treatment in general.   >25 minutes spent in face to face time with patient, >50% spent in counselling or coordination of care.  Continue current meds.

## 2017-02-17 NOTE — Assessment & Plan Note (Signed)
Uric acid <8, no recent flares of gout. Using colchicine prn, but not often.  Continue as is.

## 2017-02-17 NOTE — Telephone Encounter (Signed)
Please call/contact urology. Has seen Dr. Rogers Blocker in [place].   H/o prostate cancer.   PSA still low but detectable at 0.14.  . I didn't have recent notes from urology so that led to me checking his PSA.  I want their opinion on his PSA since it wasn't zero.  Thanks.

## 2017-02-17 NOTE — Assessment & Plan Note (Signed)
Continue statin, lab d/w pt.

## 2017-02-17 NOTE — Assessment & Plan Note (Signed)
If more trouble with lightheadedness, then stop HCTZ and let me know.  We may need to trim his BP meds.  He agrees.  Labs d/w pt.

## 2017-02-17 NOTE — Assessment & Plan Note (Signed)
Flu vaccine - utd  PPSV23 - utd Hearing - failed.  Declined hearing aids.   Colonoscopy 2016.

## 2017-02-17 NOTE — Assessment & Plan Note (Addendum)
PSA still low but detectable.  Will notify uro and ask for input.  Has see Dr. Rogers Blocker in [place].   D/w pt.

## 2017-02-18 NOTE — Telephone Encounter (Signed)
Faxed note re:  PSA below.  Office was closed.

## 2017-03-03 NOTE — Telephone Encounter (Signed)
Not yet

## 2017-03-03 NOTE — Telephone Encounter (Signed)
Refaxed request.

## 2017-03-03 NOTE — Telephone Encounter (Signed)
Have you received anything from Dr. Rogers Blocker?

## 2017-03-17 NOTE — Telephone Encounter (Signed)
I don't recall getting anything back on this. Please check with urology again.  Thanks.

## 2017-03-17 NOTE — Telephone Encounter (Signed)
Noted, thanks.  I'll defer at this point and I'll await the notes from uro.

## 2017-03-17 NOTE — Telephone Encounter (Signed)
Faxed again, 3rd request with confirmations on faxes.

## 2017-03-17 NOTE — Telephone Encounter (Signed)
Fax received stating that patient has an appointment with Dr. Yves Dill on 03/31/17 at 10:00 and that patient was notified.

## 2017-03-31 DIAGNOSIS — C61 Malignant neoplasm of prostate: Secondary | ICD-10-CM | POA: Diagnosis not present

## 2017-03-31 DIAGNOSIS — N5201 Erectile dysfunction due to arterial insufficiency: Secondary | ICD-10-CM | POA: Diagnosis not present

## 2017-03-31 DIAGNOSIS — D400 Neoplasm of uncertain behavior of prostate: Secondary | ICD-10-CM | POA: Diagnosis not present

## 2017-04-05 ENCOUNTER — Other Ambulatory Visit: Payer: Self-pay | Admitting: Ophthalmology

## 2017-04-05 DIAGNOSIS — H532 Diplopia: Secondary | ICD-10-CM | POA: Diagnosis not present

## 2017-04-05 DIAGNOSIS — H53483 Generalized contraction of visual field, bilateral: Secondary | ICD-10-CM

## 2017-04-05 DIAGNOSIS — Z8669 Personal history of other diseases of the nervous system and sense organs: Secondary | ICD-10-CM

## 2017-04-08 ENCOUNTER — Ambulatory Visit
Admission: RE | Admit: 2017-04-08 | Discharge: 2017-04-08 | Disposition: A | Discharge status: Home / Self Care | Payer: Medicare Other | Source: Ambulatory Visit | Attending: Ophthalmology | Admitting: Ophthalmology

## 2017-04-08 DIAGNOSIS — H53483 Generalized contraction of visual field, bilateral: Secondary | ICD-10-CM

## 2017-04-08 DIAGNOSIS — H532 Diplopia: Secondary | ICD-10-CM | POA: Diagnosis not present

## 2017-04-08 DIAGNOSIS — Z8669 Personal history of other diseases of the nervous system and sense organs: Secondary | ICD-10-CM | POA: Diagnosis not present

## 2017-04-08 DIAGNOSIS — R4781 Slurred speech: Secondary | ICD-10-CM | POA: Diagnosis not present

## 2017-04-08 LAB — POCT I-STAT CREATININE: Creatinine, Ser: 1 mg/dL (ref 0.61–1.24)

## 2017-04-08 MED ORDER — GADOBENATE DIMEGLUMINE 529 MG/ML IV SOLN
20.0000 mL | Freq: Once | INTRAVENOUS | Status: AC | PRN
  Administered 2017-04-08: 20 mL via INTRAVENOUS

## 2017-04-13 DIAGNOSIS — H532 Diplopia: Secondary | ICD-10-CM | POA: Diagnosis not present

## 2017-06-14 DIAGNOSIS — E538 Deficiency of other specified B group vitamins: Secondary | ICD-10-CM | POA: Diagnosis not present

## 2017-06-14 DIAGNOSIS — I10 Essential (primary) hypertension: Secondary | ICD-10-CM | POA: Diagnosis not present

## 2017-06-14 DIAGNOSIS — E559 Vitamin D deficiency, unspecified: Secondary | ICD-10-CM | POA: Diagnosis not present

## 2017-06-14 DIAGNOSIS — G20 Parkinson's disease: Secondary | ICD-10-CM | POA: Diagnosis not present

## 2017-06-14 DIAGNOSIS — R5383 Other fatigue: Secondary | ICD-10-CM | POA: Diagnosis not present

## 2017-06-14 DIAGNOSIS — H538 Other visual disturbances: Secondary | ICD-10-CM | POA: Diagnosis not present

## 2017-06-14 DIAGNOSIS — R251 Tremor, unspecified: Secondary | ICD-10-CM | POA: Diagnosis not present

## 2017-12-14 ENCOUNTER — Telehealth: Payer: Self-pay | Admitting: *Deleted

## 2017-12-14 DIAGNOSIS — G20 Parkinson's disease: Secondary | ICD-10-CM | POA: Diagnosis not present

## 2017-12-14 DIAGNOSIS — R413 Other amnesia: Secondary | ICD-10-CM | POA: Diagnosis not present

## 2017-12-14 DIAGNOSIS — R251 Tremor, unspecified: Secondary | ICD-10-CM | POA: Diagnosis not present

## 2017-12-14 DIAGNOSIS — H538 Other visual disturbances: Secondary | ICD-10-CM | POA: Diagnosis not present

## 2017-12-14 NOTE — Telephone Encounter (Signed)
Copied from [place] [phone]. Topic: General - Other >> Dec 14, 2017  3:23 PM Carolyn Stare wrote:  Pt saw his neurologist Dr Melrose Nakayama and he suggested pt taking  terazosin which is new drug that has had positive impact on pts with [place] . Dr Melrose Nakayama wanted them to reach out to Dr Damita Dunnings and ask him if he think it will be ok it the pt tried this medicine since he has had prostate cancer in the past . Also Dr Melrose Nakayama would like to take pt off of his bp med for a month lisinopril

## 2017-12-15 MED ORDER — TERAZOSIN HCL 1 MG PO CAPS: 1.0000 mg | ORAL_CAPSULE | Freq: Every day | ORAL | 3 refills | Status: DC

## 2017-12-15 MED ORDER — METOPROLOL SUCCINATE ER 100 MG PO TB24: ORAL_TABLET | ORAL | Status: DC

## 2017-12-15 NOTE — Telephone Encounter (Signed)
Notes from neuro-  - If patient was to start Terazosin, would start with 1 mg daily for one week, then increase by 1 mg every week until patient is taking 5 mg daily. Would also reduce metoprolol.   He was also taken off lisinopril due to cough.   ==============  Would stop lisinopril.  Decrease metoprolol to 1/2 tab a day for now. Start terazosin 1mg  a day.  Schedule f/u here in the clinic in about 1 week.  terazosin rx sent.  Thanks.

## 2017-12-15 NOTE — Telephone Encounter (Signed)
Patient and wife advised, appointment scheduled.

## 2017-12-21 IMAGING — MR MR HEAD W/O CM
10 series · 48 of 48 positions shown · non-contrast
Comparison: None.

CLINICAL DATA: 69-year-old male with progressive bilateral vision
loss over the past 2 years. Slowing of speech. bradykinesia. Initial
encounter.

EXAM:
MRI HEAD WITHOUT CONTRAST
TECHNIQUE: Multiplanar, multiecho pulse sequences of the brain and surrounding
structures were obtained without intravenous contrast.

[Series 2: T1 · sagittal · 5.0mm · 0.45mm/px · 2 of 23 slices shown (1 of 2)]
[im 1/23]
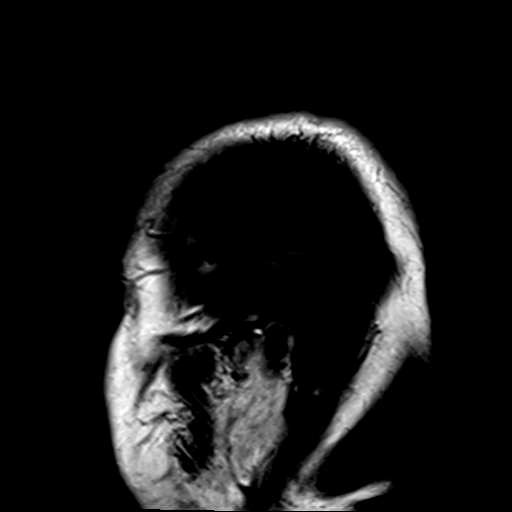
[im 23/23]
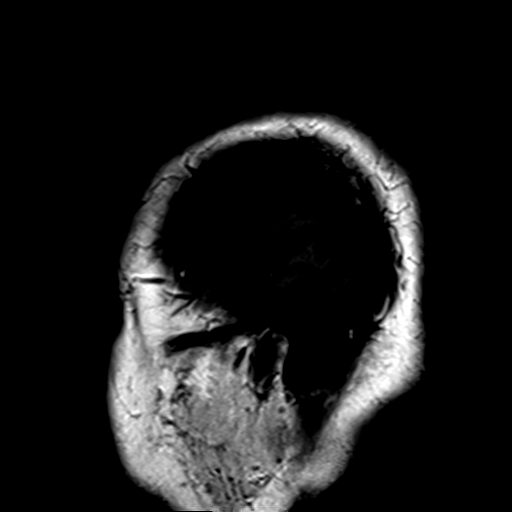

[Series 4: DWI · axial · 3.0mm · 1.80mm/px · z∈[-113,+47]mm · 7 of 53 slices shown (1 of 2)]
[im 1/53]
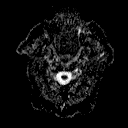
[im 9/53]
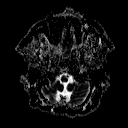
[im 18/53]
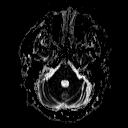
[im 27/53]
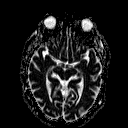
[im 35/53]
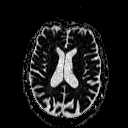
[im 44/53]
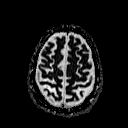
[im 53/53]
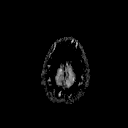

[Series 6: DWI · coronal · 3.0mm · 1.80mm/px · 6 of 45 slices shown (2 of 2)]
[im 1/45]
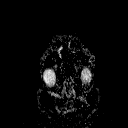
[im 9/45]
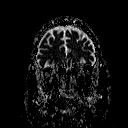
[im 18/45]
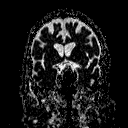
[im 27/45]
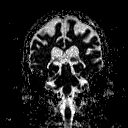
[im 36/45]
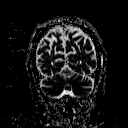
[im 45/45]
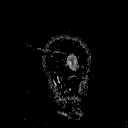

[Series 7: T2 · axial · 5.0mm · 0.60mm/px · z∈[-109,+44]mm · 3 of 25 slices shown (1 of 3)]
[im 1/25]
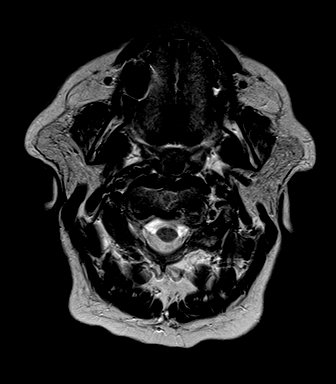
[im 13/25]
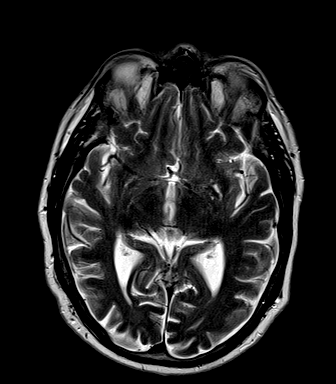
[im 25/25]
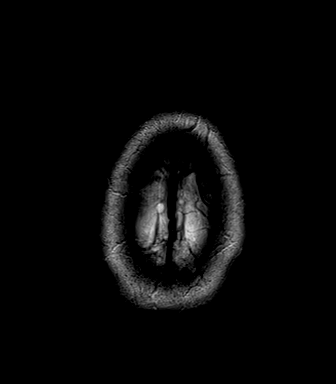

[Series 8: FLAIR · axial · 5.0mm · 0.45mm/px · z∈[-109,+44]mm · 3 of 25 slices shown]
[im 1/25]
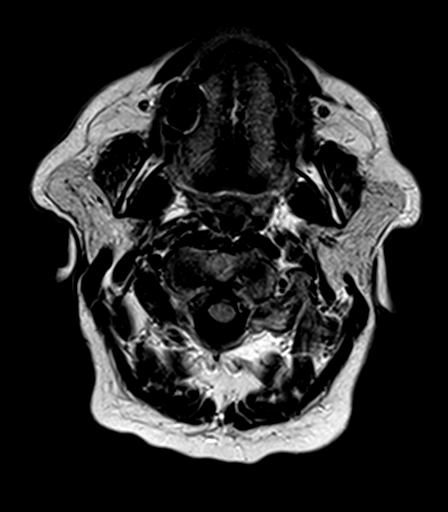
[im 13/25]
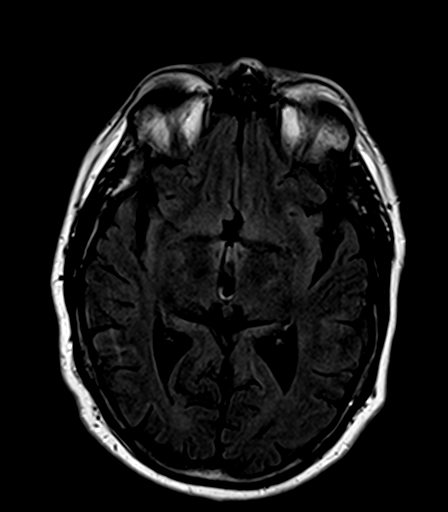
[im 25/25]
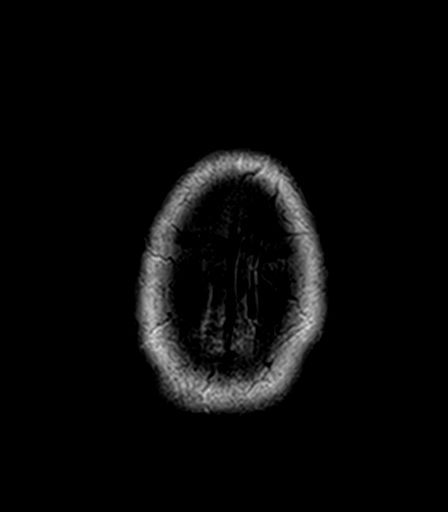

[Series 9: T2 · axial · 5.0mm · 0.45mm/px · z∈[-109,+44]mm · 3 of 25 slices shown (2 of 3)]
[im 1/25]
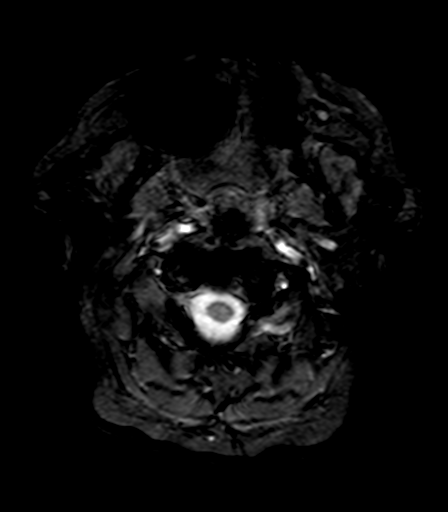
[im 13/25]
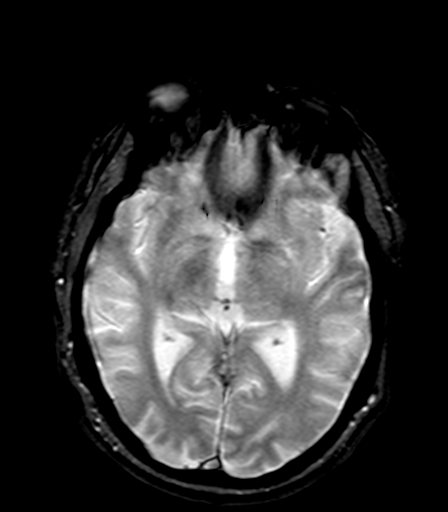
[im 25/25]
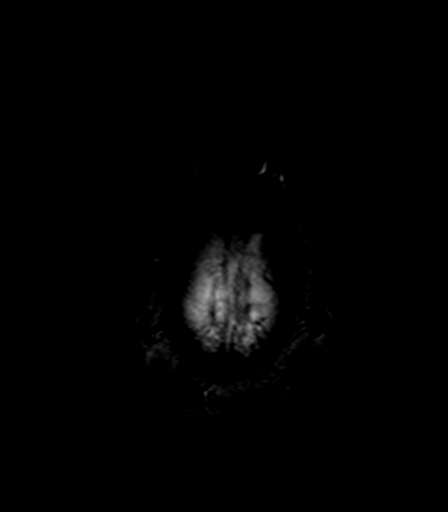

[Series 10: T1 · axial · 3.0mm · 1.00mm/px · z∈[-121,+53]mm · 8 of 60 slices shown (2 of 2)]
[im 1/60]
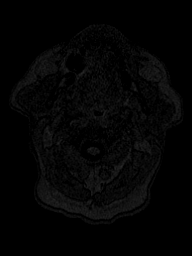
[im 9/60]
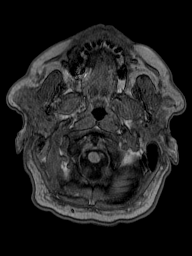
[im 17/60]
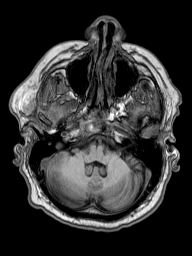
[im 26/60]
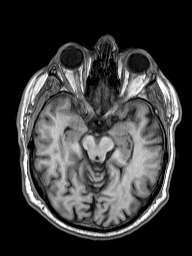
[im 34/60]
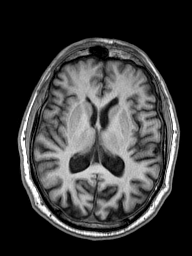
[im 43/60]
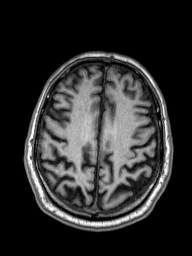
[im 51/60]
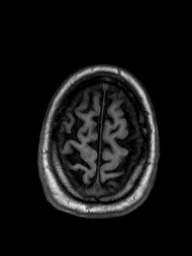
[im 60/60]
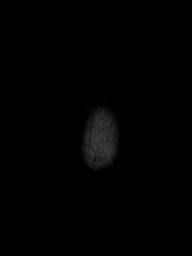

[Series 11: T2 · coronal · 5.0mm · 0.49mm/px · 3 of 27 slices shown (3 of 3)]
[im 1/27]
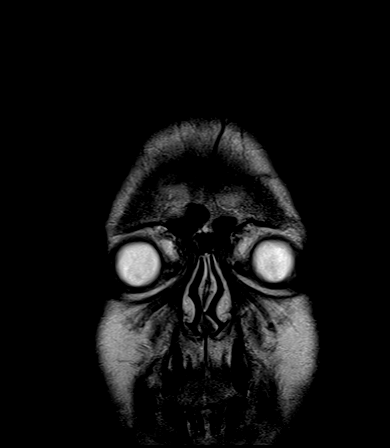
[im 14/27]
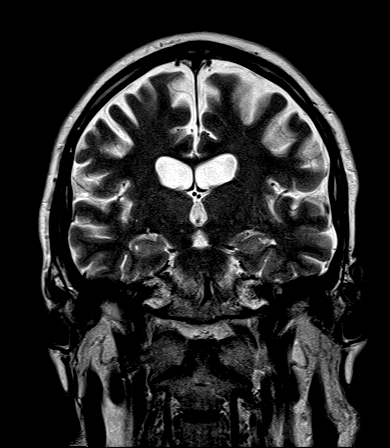
[im 27/27]
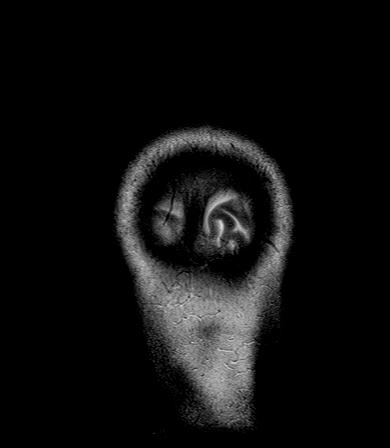

[Series 100: (id) ax · axial · 3.0mm · 1.80mm/px · z∈[-113,+47]mm · 7 of 55 slices shown]
[im 1/55]
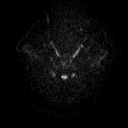
[im 10/55]
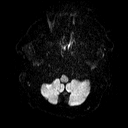
[im 19/55]
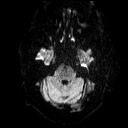
[im 28/55]
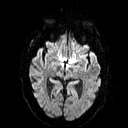
[im 37/55]
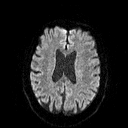
[im 46/55]
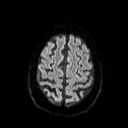
[im 55/55]
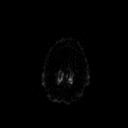

[Series 101: (id) cor · coronal · 3.0mm · 1.80mm/px · 6 of 45 slices shown]
[im 1/45]
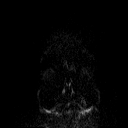
[im 9/45]
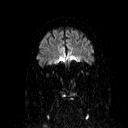
[im 18/45]
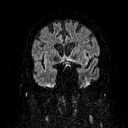
[im 27/45]
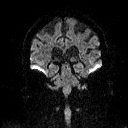
[im 36/45]
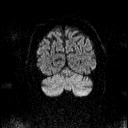
[im 45/45]
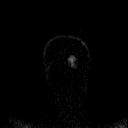

[48 of 48 positions shown; findings below may reference images not displayed]

FINDINGS: Cerebral volume is within normal limits for age. No restricted
diffusion to suggest acute infarction. No midline shift, mass
effect, evidence of mass lesion, ventriculomegaly, extra-axial
collection or acute intracranial hemorrhage. Cervicomedullary
junction and pituitary are within normal limits. Major intracranial
vascular flow voids are preserved.

Gray and white matter signal is within normal limits for age
throughout the brain. No encephalomalacia or chronic cerebral blood
products identified. Deep gray matter nuclei, brainstem, and
cerebellum appear normal.

Visible internal auditory structures appear normal. Paranasal
sinuses and mastoids are clear. Postoperative changes to both
globes. Negative scalp soft tissues. Negative visualized cervical
spine. Normal bone marrow signal.
IMPRESSION: Normal for age noncontrast MRI appearance of the brain.

## 2017-12-22 ENCOUNTER — Ambulatory Visit (INDEPENDENT_AMBULATORY_CARE_PROVIDER_SITE_OTHER): Payer: Medicare Other | Admitting: Family Medicine

## 2017-12-22 ENCOUNTER — Encounter: Payer: Self-pay | Admitting: Family Medicine

## 2017-12-22 VITALS — BP 144/80 | HR 72 | Temp 98.4°F | Ht 68.0 in | Wt 219.5 lb

## 2017-12-22 DIAGNOSIS — G20 Parkinson's disease: Secondary | ICD-10-CM | POA: Diagnosis not present

## 2017-12-22 DIAGNOSIS — Z23 Encounter for immunization: Secondary | ICD-10-CM

## 2017-12-22 MED ORDER — TERAZOSIN HCL 1 MG PO CAPS: ORAL_CAPSULE | ORAL | Status: DC

## 2017-12-22 NOTE — Patient Instructions (Signed)
Stop metoprolol for now.  If you notice heart racing, then update me.  Gradually increase terazosin by 1 mg per week.  You can take it at once- you don't have to split the dose. If you get lightheaded, then update me.   If your BP is consistently <130/<90, then stop the HCTZ also.   Update me about your BP and pulse in about 2-3 weeks, sooner if needed.  Take care.  Glad to see you.

## 2017-12-22 NOTE — Progress Notes (Signed)
Hypertension:    Plan was to stop lisinopril.  Decreased metoprolol to 50mg  a day. Started terazosin 1mg  a day.   Using medication without problems or lightheadedness: yes Chest pain with exertion:no Edema:no Short of breath:no Plan d/w pt.    He noted a slightly inc in urine stream with addition of terazosin.  Pulse is regular, 66 on MD check.  Initial value was likely due to incomplete pulse detection with pulse ox meter.    Meds, vitals, and allergies reviewed.   PMH and SH reviewed  ROS: Per HPI unless specifically indicated in ROS section   GEN: nad, alert and oriented, slightly flat affect at baseline. HEENT: mucous membranes moist NECK: supple w/o LA CV: rrr. PULM: ctab, no inc wob ABD: soft, +bs EXT: no edema SKIN: no acute rash

## 2017-12-23 NOTE — Assessment & Plan Note (Signed)
The plan is to gradually increase his terazosin to see if that helps with his Parkinson's.  He has stopped lisinopril in the meantime. Stop metoprolol for now. If noticed heart racing, then update me.  Gradually increase terazosin by 1 mg per week.   If lightheaded, then update me.   If BP is consistently <130/<90, then stop the HCTZ also.   Update me about BP and pulse in about 2-3 weeks, sooner if needed.   All questions answered.  He agrees.

## 2018-01-05 ENCOUNTER — Ambulatory Visit: Payer: Self-pay

## 2018-01-05 ENCOUNTER — Telehealth: Payer: Self-pay | Admitting: Family Medicine

## 2018-01-05 NOTE — Telephone Encounter (Signed)
Lm on pts vm requesting a call back. Should pt return call, pls advise per Dr Damita Dunnings. Also see additional phone note

## 2018-01-05 NOTE — Telephone Encounter (Signed)
See additional phone note. 

## 2018-01-05 NOTE — Telephone Encounter (Signed)
Pts wife aware of message below.

## 2018-01-05 NOTE — Telephone Encounter (Signed)
Copied from [place] [phone]. Topic: Quick Communication - See Telephone Encounter >> Jan 05, 2018 10:35 AM Rutherford Nail, NT wrote: CRM for notification. See Telephone encounter for: 01/05/18. Patient's wife calling and states that Dr Damita Dunnings wanted an update on blood pressure once the patient was a few weeks into medication.  BP: 153/87  P: 67 BP: 138/81  P: 69 BP: 140/76  P: 57 BP: 155/79  P: 65 BP: 153/84  P: 59 BP: 130/68   States that patient has been dizzy and lightheaded. Denied blacking out or passing out. Will send a clinical call to nurse triage to call him.  CB#: [phone]

## 2018-01-05 NOTE — Telephone Encounter (Signed)
Returned call to pt. and wife.  Was advised by Dr. Damita Dunnings to call with update on BP and pulse readings 2-3 wks. after last office visit.  Reported series of BP and Pulse readings.(see below)  Pt. stated he has had dizziness at intervals.  Described that it feels like he is tired and drunk.  Denied feeling faint with episodes of dizziness.  Reported the dizziness has been going on for 6 mos.  Stated it occurs with position change.  Wife stated she has observed that "he is not sure of his foot movement."  Now stated that he feels he has impaired judgement, and questioned if this is related to d/c'ing Dopamine in late September, by Neurologist.  Wife reported the pt. stopped taking HCTZ on 12/25/17; reported his BP's have been elevated 140's-150's/ 70's-80's. Stated she thought he was supposed to hold the HCTZ if the BP stayed elevated.  Wife reported the new symptoms is c/o intermittent left-sided facial twitching within past week. Denied any other new symptoms.  Stated that his blurred vision and speech impairment is unchanged, and is related to his Parkinson's disease.  Reported he has an appt. with the Neurologist within next 2 weeks.  BP 162/89, pulse 75 at 11:10 AM, during this call.  Advised will send Triage note to Dr. Damita Dunnings, to make aware of BP and current symptoms.  Wife advised to call back if symptoms worsen.  Verb. Understanding.          Message from Rutherford Nail, [place] sent at 01/05/2018 10:42 AM EDT   Summary: dizzy and lightheaded   Patient's wife calling to give Dr Damita Dunnings a list of blood pressures since starting blood pressure medication.  BP: 153/87 P: 67 BP: 138/81 P: 69 BP: 140/76 P: 57 BP: 155/79 P: 65 BP: 153/84 P: 59 BP: 130/68  States that he is also dizzy and light headed. Denies passing out or blacking out.           Reason for Disposition . Dizziness is a chronic symptom (recurrent or ongoing AND present > 4 weeks)    Reported has had dizziness x approx. 6 mos.   Described like feeling "tired and drunk."  Pt. and wife called to make Dr. Damita Dunnings aware of continued elevated BP readings and of continued dizziness.  BP checked during call at  11:10 AM: BP 162/89, pulse 75.  Answer Assessment - Initial Assessment Questions 1. DESCRIPTION: "Describe your dizziness."     Feels tired and like I'm drunk 2. LIGHTHEADED: "Do you feel lightheaded?" (e.g., somewhat faint, woozy, weak upon standing)     Lightheaded with position change 3. VERTIGO: "Do you feel like either you or the room is spinning or tilting?" (i.e. vertigo)     Denied 4. SEVERITY: "How bad is it?"  "Do you feel like you are going to faint?" "Can you stand and walk?"   - MILD - walking normally   - MODERATE - interferes with normal activities (e.g., work, school)    - SEVERE - unable to stand, requires support to walk, feels like passing out now.      moderate 5. ONSET:  "When did the dizziness begin?"     About 6 mos.  6. AGGRAVATING FACTORS: "Does anything make it worse?" (e.g., standing, change in head position)     Position change 7. HEART RATE: "Can you tell me your heart rate?" "How many beats in 15 seconds?"  (Note: not all patients can do this)  Checked per digital BP cuff: BP 162/89, P. 75 8. CAUSE: "What do you think is causing the dizziness?"     unknown 9. RECURRENT SYMPTOM: "Have you had dizziness before?" If so, ask: "When was the last time?" "What happened that time?"     Has been going on x 6 mos.  10. OTHER SYMPTOMS: "Do you have any other symptoms?" (e.g., fever, chest pain, vomiting, diarrhea, bleeding)       Per wife, she has observed he is not sure of foot movement;  Blurred vision and speech is impaired due to Parkinson's disease- unchanged; Dopamine was d/c'd by Neurology late September; twitching on left side of face that is new onset this week.  Protocols used: DIZZINESS [facility]

## 2018-01-05 NOTE — Telephone Encounter (Signed)
I spoke with pt; pt has been dizzy and lightheaded for 2 weeks and dizziness is consistent, does not come and go. No CP, SOB or H/A.pt has not passed out. Pt is presently taking Hytrin 1 mg taking 3 caps at 8 PM each night. Pt request cb after Dr Damita Dunnings reviews note.

## 2018-01-05 NOTE — Telephone Encounter (Signed)
See other phone note. I would cut back on the hytrin, down to 2mg .

## 2018-01-05 NOTE — Telephone Encounter (Signed)
Would cut the hytrin back to 2 mg at night.  Update me about his BP and how he feels in the middle of next week, sooner if needed.  2mg  may be the max tolerated dose for patient.  Thanks.

## 2018-01-06 NOTE — Telephone Encounter (Signed)
This has been addressed and patient's wife contacted (refer to other phone note documenting contact).

## 2018-01-16 DIAGNOSIS — I10 Essential (primary) hypertension: Secondary | ICD-10-CM | POA: Diagnosis not present

## 2018-01-16 DIAGNOSIS — G20 Parkinson's disease: Secondary | ICD-10-CM | POA: Diagnosis not present

## 2018-01-16 DIAGNOSIS — R413 Other amnesia: Secondary | ICD-10-CM | POA: Diagnosis not present

## 2018-01-23 ENCOUNTER — Other Ambulatory Visit: Payer: Self-pay | Admitting: *Deleted

## 2018-01-23 MED ORDER — TERAZOSIN HCL 1 MG PO CAPS: ORAL_CAPSULE | ORAL | 3 refills | Status: DC

## 2018-02-03 ENCOUNTER — Ambulatory Visit (INDEPENDENT_AMBULATORY_CARE_PROVIDER_SITE_OTHER): Payer: Medicare Other

## 2018-02-03 ENCOUNTER — Other Ambulatory Visit: Payer: Self-pay | Admitting: Family Medicine

## 2018-02-03 VITALS — BP 154/76 | HR 82 | Temp 98.2°F | Ht 68.5 in | Wt 224.5 lb

## 2018-02-03 DIAGNOSIS — Z0000 Encounter for general adult medical examination without abnormal findings: Secondary | ICD-10-CM

## 2018-02-03 DIAGNOSIS — M109 Gout, unspecified: Secondary | ICD-10-CM

## 2018-02-03 DIAGNOSIS — Z125 Encounter for screening for malignant neoplasm of prostate: Secondary | ICD-10-CM

## 2018-02-03 DIAGNOSIS — E785 Hyperlipidemia, unspecified: Secondary | ICD-10-CM

## 2018-02-03 LAB — LIPID PANEL
Cholesterol: 160 mg/dL (ref 0–200)
HDL: 49 mg/dL (ref >39.00)
LDL Cholesterol: 92 mg/dL (ref 0–99)
NonHDL: 111.3
Total CHOL/HDL Ratio: 3
Triglycerides: 96 mg/dL (ref 0.0–149.0)
VLDL: 19.2 mg/dL (ref 0.0–40.0)

## 2018-02-03 LAB — COMPREHENSIVE METABOLIC PANEL
ALT: 6 U/L (ref 0–53)
AST: 17 U/L (ref 0–37)
Albumin: 4.3 g/dL (ref 3.5–5.2)
Alkaline Phosphatase: 91 U/L (ref 39–117)
BUN: 15 mg/dL (ref 6–23)
CO2: 29 mEq/L (ref 19–32)
Calcium: 9.2 mg/dL (ref 8.4–10.5)
Chloride: 104 mEq/L (ref 96–112)
Creatinine, Ser: 1.09 mg/dL (ref 0.40–1.50)
GFR: 71.04 mL/min (ref >60.00)
Glucose, Bld: 101 mg/dL — ABNORMAL HIGH (ref 70–99)
Potassium: 4 mEq/L (ref 3.5–5.1)
Sodium: 141 mEq/L (ref 135–145)
Total Bilirubin: 0.5 mg/dL (ref 0.2–1.2)
Total Protein: 6.6 g/dL (ref 6.0–8.3)

## 2018-02-03 LAB — URIC ACID: Uric Acid, Serum: 6.7 mg/dL (ref 4.0–7.8)

## 2018-02-03 LAB — PSA, MEDICARE: PSA: 0.07 ng/ml — ABNORMAL LOW (ref 0.10–4.00)

## 2018-02-03 NOTE — Progress Notes (Signed)
Subjective:   Tristan Booker is a 72 y.o. male who presents for Medicare Annual/Subsequent preventive examination.  Review of Systems:  N/A Cardiac Risk Factors include: advanced age (>67men, >71 women);male gender;obesity (BMI >30kg/m2);dyslipidemia;hypertension     Objective:    Vitals: BP (!) 154/76 (BP Location: Right Arm, Patient Position: Sitting, Cuff Size: Normal)   Pulse 82   Temp 98.2 F (36.8 C) (Oral)   Ht 5' 8.5" (1.74 m) Comment: shoes  Wt 224 lb 8 oz (101.8 kg)   SpO2 95%   BMI 33.64 kg/m   Body mass index is 33.64 kg/m.  Advanced Directives 02/03/2018 02/02/2017 03/10/2016 01/29/2016 02/06/2015  Does Patient Have a Medical Advance Directive? No No No No Yes  Does patient want to make changes to medical advance directive? No - Patient declined - - - -  Copy of [place] in Chart? - - - - No - copy requested  Would patient like information on creating a medical advance directive? - Yes (MAU/Ambulatory/Procedural Areas - Information given) - Yes - Educational materials given -    Tobacco Social History   Tobacco Use  Smoking Status Never Smoker  Smokeless Tobacco Former Engineer, structural given: No   Clinical Intake:  Pre-visit preparation completed: Yes  Pain : No/denies pain Pain Score: 0-No pain     Nutritional Status: BMI > 30  Obese Nutritional Risks: None Diabetes: No  How often do you need to have someone help you when you read instructions, pamphlets, or other written materials from your doctor or pharmacy?: 1 - Never What is the last grade level you completed in school?: 12th grade  Interpreter Needed?: No  Comments: pt lives with spouse Information entered by :: LPinson, LPN  Past Medical History:  Diagnosis Date  . Gout   . Hyperlipidemia   . Hypertension   . Parkinson disease ([place])   . Prostate cancer [facility])    Past Surgical History:  Procedure Laterality Date  . CATARACT EXTRACTION  12/2004   right    . CATARACT EXTRACTION  02/2005   left  . COLONOSCOPY WITH PROPOFOL N/A 02/06/2015   Procedure: COLONOSCOPY WITH PROPOFOL;  Surgeon: Manya Silvas, MD;  Location: [facility] ENDOSCOPY;  Service: Endoscopy;  Laterality: N/A;  . HERNIA REPAIR  1974   left  . PROSTATE BIOPSY  02/27/2002   negative x 6  . PROSTATECTOMY  05/25/09   Dr. Rogers Blocker   Family History  Problem Relation Age of Onset  . Cancer Mother        breast, s/p mastectomy 1970's  . Heart failure Mother   . Dementia Mother   . Emphysema Father        smoker  . COPD Father        Past smoker  . Heart failure Father   . Arthritis Brother   . Colon cancer Neg Hx    Social History   Socioeconomic History  . Marital status: Married    Spouse name: Not on file  . Number of children: 3  . Years of education: Not on file  . Highest education level: Not on file  Occupational History  . Occupation: vinyl siding install, window replacement    Comment: part time  Social Needs  . Financial resource strain: Not on file  . Food insecurity:    Worry: Not on file    Inability: Not on file  . Transportation needs:    Medical: Not on  file    Non-medical: Not on file  Tobacco Use  . Smoking status: Never Smoker  . Smokeless tobacco: Former Network engineer and Sexual Activity  . Alcohol use: Yes    Alcohol/week: 2.0 standard drinks    Types: 2 Cans of beer per week    Comment: 6 pack a week  . Drug use: No  . Sexual activity: Yes  Lifestyle  . Physical activity:    Days per week: Not on file    Minutes per session: Not on file  . Stress: Not on file  Relationships  . Social connections:    Talks on phone: Not on file    Gets together: Not on file    Attends religious service: Not on file    Active member of club or organization: Not on file    Attends meetings of clubs or organizations: Not on file    Relationship status: Not on file  Other Topics Concern  . 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 aspirin 81 MG tablet Take 81 mg by mouth daily.    . carbidopa-levodopa (SINEMET IR) 25-100 MG tablet Take 1 tablet by mouth 3 (three) times daily.  . cholecalciferol (VITAMIN D) 1000 units tablet Take 1,000 Units by mouth daily.  . colchicine 0.6 MG tablet TAKE ONE TABLET BY MOUTH ONCE DAILY AS NEEDED FOR  GOUT  PAIN  . docusate sodium (COLACE) 100 MG capsule Take 100 mg by mouth daily as needed.   . donepezil (ARICEPT) 5 MG tablet Take 5 mg by mouth at bedtime.  . hydrochlorothiazide (MICROZIDE) 12.5 MG capsule Take 1 capsule (12.5 mg total) by mouth every morning.  . ranitidine (ZANTAC) 75 MG tablet As needed.   . simvastatin (ZOCOR) 40 MG tablet Take 1 tablet (40 mg total) by mouth at bedtime.  Marland Kitchen terazosin (HYTRIN) 1 MG capsule 2 pills a day for now.  Increase by 1 tab per week, max 5 tabs a day.  . vitamin B-12 (CYANOCOBALAMIN) 1000 MCG tablet Take 1,000 mcg by mouth daily.   No facility-administered encounter medications on file as of 02/03/2018.     Activities of Daily Living In your present state of health, do you have any difficulty performing the following activities: 02/03/2018  Hearing? N  Vision? Y  Difficulty concentrating or making decisions? Y  Walking or climbing stairs? N  Dressing or bathing? Y  Doing errands, shopping? Y  Preparing Food and eating ? Y  Using the Toilet? N  In the past six months, have you accidently leaked urine? N  Do you have problems with loss of bowel control? N  Managing your Medications? Y  Managing your Finances? Y  Housekeeping or managing your Housekeeping? Y  Some recent data might be hidden    Patient Care Team: Tonia Ghent, MD as PCP - General (Family Medicine) Estill Cotta, MD as Consulting Physician (Ophthalmology) Anabel Bene, MD as Referring  Physician (Neurology) Royston Cowper, MD as Consulting Physician (Urology)   Assessment:   This is a routine wellness examination for [facility].  Exercise Activities and Dietary recommendations Current Exercise Habits: Home exercise routine, Type of exercise: walking, Time (Minutes): 15, Frequency (Times/Week): 7, Weekly Exercise (Minutes/Week): 105, Intensity: Mild, Exercise limited by: None  identified  Goals    . Patient Stated     Starting 02/03/2018, I will continue to walk at least 15 minutes daily.        Fall Risk Fall Risk  02/03/2018 02/03/2018 02/02/2017 02/27/2016 01/29/2016  Falls in the past year? 1 0 No No No  Comment 3 falls secondary to Parkinson's disease - - [place] Telephone Survey: data to providers prior to load -  Number falls in past yr: 1 - - - -  Injury with Fall? 0 - - - -   Depression Screen PHQ 2/9 Scores 02/03/2018 02/03/2018 02/02/2017 01/29/2016  PHQ - 2 Score 2 0 0 0  PHQ- 9 Score 5 0 0 -    Cognitive Function MMSE - Mini Mental State Exam 02/03/2018 02/02/2017 01/29/2016  Orientation to time 5 5 5   Orientation to Place 5 5 5   Registration 3 3 3   Attention/ Calculation 0 0 0  Recall 3 3 3   Language- name 2 objects 0 0 0  Language- repeat 1 1 1   Language- follow 3 step command 3 3 3   Language- read & follow direction 0 0 0  Write a sentence 0 0 0  Copy design 0 0 0  Total score 20 20 20      PLEASE NOTE: A Mini-Cog screen was completed. Maximum score is 20. A value of 0 denotes this part of [place] MMSE was not completed or the patient failed this part of the Mini-Cog screening.   Mini-Cog Screening Orientation to Time - Max 5 pts Orientation to Place - Max 5 pts Registration - Max 3 pts Recall - Max 3 pts Language Repeat - Max 1 pts Language Follow 3 Step Command - Max 3 pts     Immunization History  Administered Date(s) Administered  . Influenza Split 01/20/2011, 12/28/2011  . Influenza Whole 12/31/2005, 01/14/2010  . Influenza,  Seasonal, Injecte, Preservative Fre 12/05/2015  . Influenza,inj,Quad PF,6+ Mos 01/01/2013, 02/02/2017, 12/22/2017  . Influenza-Unspecified 01/22/2015  . Pneumococcal Conjugate-13 01/29/2016  . Pneumococcal Polysaccharide-23 02/02/2017  . Td 09/19/1996, 11/30/2007  . Zoster 02/21/2013    Screening Tests Health Maintenance  Topic Date Due  . TETANUS/TDAP  03/22/2019 (Originally 11/29/2017)  . COLONOSCOPY  02/06/2020  . INFLUENZA VACCINE  Completed  . Hepatitis C Screening  Completed  . PNA vac Low Risk Adult  Completed     Plan:     I have personally reviewed, addressed, and noted the following in the patient's chart:  A. Medical and social history B. Use of alcohol, tobacco or illicit drugs  C. Current medications and supplements D. Functional ability and status E.  Nutritional status F.  Physical activity G. Advance directives H. List of other physicians I.  Hospitalizations, surgeries, and ER visits in previous 12 months J.  [place] to include hearing, vision, cognitive, depression L. Referrals and appointments - none  In addition, I have reviewed and discussed with patient certain preventive protocols, quality metrics, and best practice recommendations. A written personalized care plan for preventive services as well as general preventive health recommendations were provided to patient.  See attached scanned questionnaire for additional information.   Signed,   Lindell Noe, MHA, BS, LPN Health Coach

## 2018-02-03 NOTE — Patient Instructions (Addendum)
[name] , Thank you for taking time to come for your Medicare Wellness Visit. I appreciate your ongoing commitment to your health goals. Please review the following plan we discussed and let me know if I can assist you in the future.   These are the goals we discussed: Goals    . Patient Stated     Starting 02/03/2018, I will continue to walk at least 15 minutes daily.        This is a list of the screening recommended for you and due dates:  Health Maintenance  Topic Date Due  . Tetanus Vaccine  03/22/2019*  . Colon Cancer Screening  02/06/2020  . Flu Shot  Completed  .  Hepatitis C: One time screening is recommended by Center for Disease Control  (CDC) for  adults born from [birth year] through 1963.   Completed  . Pneumonia vaccines  Completed  *Topic was postponed. The date shown is not the original due date.   Preventive Care for Adults  A healthy lifestyle and preventive care can promote health and wellness. Preventive health guidelines for adults include the following key practices.  . A routine yearly physical is a good way to check with your health care provider about your health and preventive screening. It is a chance to share any concerns and updates on your health and to receive a thorough exam.  . Visit your dentist for a routine exam and preventive care every 6 months. Brush your teeth twice a day and floss once a day. Good oral hygiene prevents tooth decay and gum disease.  . The frequency of eye exams is based on your age, health, family medical history, use  of contact lenses, and other factors. Follow your health care provider's recommendations for frequency of eye exams.  . Eat a healthy diet. Foods like vegetables, fruits, whole grains, low-fat dairy products, and lean protein foods contain the nutrients you need without too many calories. Decrease your intake of foods high in solid fats, added sugars, and salt. Eat the right amount of calories for you. Get information  about a proper diet from your health care provider, if necessary.  . Regular physical exercise is one of the most important things you can do for your health. Most adults should get at least 150 minutes of moderate-intensity exercise (any activity that increases your heart rate and causes you to sweat) each week. In addition, most adults need muscle-strengthening exercises on 2 or more days a week.  Silver Sneakers may be a benefit available to you. To determine eligibility, you may visit the website: www.silversneakers.com or contact program at 347-429-2529 Mon-Fri between 8AM-8PM.   . Maintain a healthy weight. The body mass index (BMI) is a screening tool to identify possible weight problems. It provides an estimate of body fat based on height and weight. Your health care provider can find your BMI and can help you achieve or maintain a healthy weight.   For adults 20 years and older: ? A BMI below 18.5 is considered underweight. ? A BMI of 18.5 to 24.9 is normal. ? A BMI of 25 to 29.9 is considered overweight. ? A BMI of 30 and above is considered obese.   . Maintain normal blood lipids and cholesterol levels by exercising and minimizing your intake of saturated fat. Eat a balanced diet with plenty of fruit and vegetables. Blood tests for lipids and cholesterol should begin at age 68 and be repeated every 5 years. If your lipid or  cholesterol levels are high, you are over 50, or you are at high risk for heart disease, you may need your cholesterol levels checked more frequently. Ongoing high lipid and cholesterol levels should be treated with medicines if diet and exercise are not working.  . If you smoke, find out from your health care provider how to quit. If you do not use tobacco, please do not start.  . If you choose to drink alcohol, please do not consume more than 2 drinks per day. One drink is considered to be 12 ounces (355 mL) of beer, 5 ounces (148 mL) of wine, or 1.5 ounces (44  mL) of liquor.  . If you are 24-62 years old, ask your health care provider if you should take aspirin to prevent strokes.  . Use sunscreen. Apply sunscreen liberally and repeatedly throughout the day. You should seek shade when your shadow is shorter than you. Protect yourself by wearing long sleeves, pants, a wide-brimmed hat, and sunglasses year round, whenever you are outdoors.  . Once a month, do a whole body skin exam, using a mirror to look at the skin on your back. Tell your health care provider of new moles, moles that have irregular borders, moles that are larger than a pencil eraser, or moles that have changed in shape or color.

## 2018-02-03 NOTE — Progress Notes (Signed)
PCP notes:   Health maintenance:  Tetanus vaccine - postponed/insurance  Abnormal screenings:   Hearing - failed  Hearing Screening   125Hz  250Hz  500Hz  1000Hz  2000Hz  3000Hz  4000Hz  6000Hz  8000Hz   Right ear:   40 40 40  0    Left ear:   40 40 40  0     Fall risk - hx of multiple falls Fall Risk  02/03/2018 02/03/2018 02/02/2017 02/27/2016 01/29/2016  Falls in the past year? 1 0 No No No  Comment 3 falls secondary to Parkinson's disease - - Emmi Telephone Survey: data to providers prior to load -  Number falls in past yr: 1 - - - -  Injury with Fall? 0 - - - -   Depression score: 5 Depression screen [facility] 2/9 02/03/2018 02/03/2018 02/02/2017 01/29/2016 01/27/2015  Decreased Interest 0 0 0 0 0  Down, Depressed, Hopeless 2 0 0 0 0  PHQ - 2 Score 2 0 0 0 0  Altered sleeping 3 0 0 - -  Tired, decreased energy 0 0 0 - -  Change in appetite 0 0 0 - -  Feeling bad or failure about yourself  0 0 0 - -  Trouble concentrating 0 0 0 - -  Moving slowly or fidgety/restless 0 0 0 - -  Suicidal thoughts 0 0 - - -  PHQ-9 Score 5 0 0 - -  Difficult doing work/chores Somewhat difficult Not difficult at all Not difficult at all - -   Patient concerns:   None  Nurse concerns:  None  Next PCP appt:   02/20/2018 @ 0945  I reviewed health advisor's note, was available for consultation on the day of service listed in this note, and agree with documentation and plan. Elsie Stain, MD.

## 2018-02-08 ENCOUNTER — Other Ambulatory Visit: Payer: Self-pay

## 2018-02-08 ENCOUNTER — Encounter: Payer: Self-pay | Admitting: Family Medicine

## 2018-02-08 ENCOUNTER — Ambulatory Visit (INDEPENDENT_AMBULATORY_CARE_PROVIDER_SITE_OTHER): Payer: Medicare Other | Admitting: Family Medicine

## 2018-02-08 DIAGNOSIS — J069 Acute upper respiratory infection, unspecified: Secondary | ICD-10-CM

## 2018-02-08 MED ORDER — DOXYCYCLINE HYCLATE 100 MG PO TABS: 100.0000 mg | ORAL_TABLET | Freq: Two times a day (BID) | ORAL | 0 refills | Status: DC

## 2018-02-08 NOTE — Patient Instructions (Signed)
Rest and fluids.  If you take aleve-D, take the least amount and with food/fluid.  Take mucinex (not mucinex-D) if needed, with plenty of fluids.  Hold the doxycycline for now.  Start if you have a lot of discolored sputum and update [place] as needed.  I think you will likely not need it.  Take care.  Glad to see you.

## 2018-02-08 NOTE — Progress Notes (Signed)
duration of symptoms: about 4 days.   Rhinorrhea: yes Congestion: yes ear pain: no sore throat: scratchy more than sore, then some soreness.   Cough: yes, worse in the meantime, with some chest congestion.  Some sputum, clear.   Myalgias: no Fevers: none known.   No vomiting or diarrhea.   No rash.   No clear sick contacts.    Per HPI unless specifically indicated in ROS section  Meds, vitals, and allergies reviewed.   GEN: nad, alert and oriented HEENT: mucous membranes moist, TM w/o erythema, nasal epithelium injected, OP with cobblestoning NECK: supple w/o LA CV: rrr. PULM: ctab, no inc wob ABD: soft, +bs EXT: no edema Sinuses not tender to palpation.

## 2018-02-10 ENCOUNTER — Encounter: Payer: Medicare Other | Admitting: Family Medicine

## 2018-02-10 DIAGNOSIS — J069 Acute upper respiratory infection, unspecified: Secondary | ICD-10-CM | POA: Insufficient documentation

## 2018-02-10 NOTE — Assessment & Plan Note (Signed)
Rest and fluids.  If taking aleve-D, take the least amount and with food/fluid.  Take mucinex (not mucinex-D) if needed, with plenty of fluids.  Hold the doxycycline for now.  Start if having a lot of discolored sputum and update [place] as needed.  I think he will likely not need it.  Okay for outpatient follow-up.

## 2018-02-20 ENCOUNTER — Encounter: Payer: Self-pay | Admitting: Family Medicine

## 2018-02-20 ENCOUNTER — Ambulatory Visit (INDEPENDENT_AMBULATORY_CARE_PROVIDER_SITE_OTHER): Payer: Medicare Other | Admitting: Family Medicine

## 2018-02-20 VITALS — BP 154/76 | HR 82 | Temp 98.2°F | Ht 68.5 in | Wt 224.5 lb

## 2018-02-20 DIAGNOSIS — G20 Parkinson's disease: Secondary | ICD-10-CM

## 2018-02-20 DIAGNOSIS — Z0000 Encounter for general adult medical examination without abnormal findings: Secondary | ICD-10-CM

## 2018-02-20 DIAGNOSIS — J069 Acute upper respiratory infection, unspecified: Secondary | ICD-10-CM | POA: Diagnosis not present

## 2018-02-20 DIAGNOSIS — E785 Hyperlipidemia, unspecified: Secondary | ICD-10-CM | POA: Diagnosis not present

## 2018-02-20 DIAGNOSIS — I10 Essential (primary) hypertension: Secondary | ICD-10-CM | POA: Diagnosis not present

## 2018-02-20 DIAGNOSIS — M109 Gout, unspecified: Secondary | ICD-10-CM | POA: Diagnosis not present

## 2018-02-20 DIAGNOSIS — C61 Malignant neoplasm of prostate: Secondary | ICD-10-CM

## 2018-02-20 DIAGNOSIS — M25519 Pain in unspecified shoulder: Secondary | ICD-10-CM | POA: Diagnosis not present

## 2018-02-20 DIAGNOSIS — Z7189 Other specified counseling: Secondary | ICD-10-CM

## 2018-02-20 MED ORDER — HYDROCHLOROTHIAZIDE 12.5 MG PO CAPS: 12.5000 mg | ORAL_CAPSULE | ORAL | 3 refills | Status: DC

## 2018-02-20 MED ORDER — TERAZOSIN HCL 2 MG PO CAPS: 2.0000 mg | ORAL_CAPSULE | Freq: Every day | ORAL | 3 refills | Status: DC

## 2018-02-20 MED ORDER — SIMVASTATIN 40 MG PO TABS: 40.0000 mg | ORAL_TABLET | Freq: Every day | ORAL | 3 refills | Status: DC

## 2018-02-20 NOTE — Patient Instructions (Addendum)
The tetanus shot may be cheaper at the pharmacy.   Check with your insurance to see if they will cover the shingles shot. Let me know if you want to go to PT for your shoulder.   Change to the 2mg  terazosin pill- this isn't a dose change.   Take care.  Glad to see you.

## 2018-02-20 NOTE — Progress Notes (Signed)
Hearing screen d/w pt.  Hearing aids declined.   Flu vaccine- utd  PNA- utd Shingles out of stock.  Tetanus 2009.   Colonoscopy 2016.   PSA 0.07.   Advance directive- wife designated if patient were incapacitated.   He has urology f/u pending with Dr. Eliberto Ivory with urology.  No dysuria but still trouble with control at baseline.    Parkinsons.  Per neuro. Compliant. Has f/u pending.  Still with balance changes and bradykinesia. Most trouble with mobility.  "I'm careful."  Last fall was a few months ago, mechanical, going up stairs in low light, tripped.  Cautions d/w pt.  We talked about ST and PT.  He can consider.    L shoulder pain.  Injury about 15 years ago.  Pain sleeping on L side.  Full ROM but with some discomfort.  No recent injury.  D/w pt about PT/options.    Hypertension:    Using medication without problems or lightheadedness: yes Chest pain with exertion:no Edema:no Short of breath:no  Gout.  No recent sx.  Has colchicine to use prn.  No recent use.    Elevated Cholesterol: Using medications without problems:yes Muscle aches: not from statin, see above.   Diet compliance: generally healthy Exercise: encouraged.   URI.  Cough is some better, still with throat clearing.  No fevers.  Not SOB.  Finishing doxy in the near future.    Meds, vitals, and allergies reviewed.   PMH and SH reviewed  ROS: Per HPI unless specifically indicated in ROS section   GEN: nad, alert and oriented HEENT: mucous membranes moist NECK: supple w/o LA CV: rrr. PULM: ctab, no inc wob ABD: soft, +bs EXT: no edema SKIN: no acute rash Throat clearing noted.  Overall bradykinesia noted.   L shoulder with normal ROM but pain with int rotation.  No arm drop.  abd lipoma noted, in the LUQ, not ttp.  Discussed with patient.  Reassured.  He can update me as needed. Umbilical hernia noted, soft.   Discussed with patient.  I would not intervene at this point given his other issues.

## 2018-02-23 NOTE — Assessment & Plan Note (Signed)
Continue as is.  Labs discussed with patient.  He agrees.  Able to tolerate statin.

## 2018-02-23 NOTE — Assessment & Plan Note (Signed)
Hearing screen d/w pt.  Hearing aids declined.   Flu vaccine- utd  PNA- utd Shingles out of stock.  Tetanus 2009.   Colonoscopy 2016.   PSA 0.07.   Advance directive- wife designated if patient were incapacitated.

## 2018-02-23 NOTE — Assessment & Plan Note (Signed)
Continue as is.  I do not want to induce hypotension.  Labs discussed with patient.  He agrees.

## 2018-02-23 NOTE — Assessment & Plan Note (Signed)
Advance directive- wife designated if patient were incapacitated.  

## 2018-02-23 NOTE — Assessment & Plan Note (Signed)
Per neuro. Compliant. Has f/u pending.  Still with balance changes and bradykinesia. Most trouble with mobility.  "I'm careful."  Last fall was a few months ago, mechanical, going up stairs in low light, tripped.  Cautions d/w pt.  We talked about ST and PT.  He can consider.

## 2018-02-23 NOTE — Assessment & Plan Note (Signed)
D/w pt about PT/options.  He can consider.

## 2018-02-23 NOTE — Assessment & Plan Note (Signed)
He has urology f/u pending with Dr. Eliberto Ivory with urology.  No dysuria but still trouble with control at baseline.   His PSA is still detectable but lower than previous, now at 0.07.  Discussed with patient.  I will route this over to Dr. Eliberto Ivory with urology for his input and as FYI.

## 2018-02-23 NOTE — Assessment & Plan Note (Signed)
No recent sx.  Has colchicine to use prn.  No recent use.   Continue as is.  He agrees.

## 2018-02-23 NOTE — Assessment & Plan Note (Signed)
Improved some in the meantime.  He will finish doxycycline.  Update me as needed.  Lungs are clear.  He agrees.

## 2018-03-01 DIAGNOSIS — R413 Other amnesia: Secondary | ICD-10-CM | POA: Diagnosis not present

## 2018-03-01 DIAGNOSIS — H538 Other visual disturbances: Secondary | ICD-10-CM | POA: Diagnosis not present

## 2018-03-01 DIAGNOSIS — G20 Parkinson's disease: Secondary | ICD-10-CM | POA: Diagnosis not present

## 2018-03-08 DIAGNOSIS — H538 Other visual disturbances: Secondary | ICD-10-CM | POA: Diagnosis not present

## 2018-03-08 DIAGNOSIS — Z7982 Long term (current) use of aspirin: Secondary | ICD-10-CM | POA: Diagnosis not present

## 2018-03-08 DIAGNOSIS — M109 Gout, unspecified: Secondary | ICD-10-CM | POA: Diagnosis not present

## 2018-03-08 DIAGNOSIS — K5790 Diverticulosis of intestine, part unspecified, without perforation or abscess without bleeding: Secondary | ICD-10-CM | POA: Diagnosis not present

## 2018-03-08 DIAGNOSIS — I10 Essential (primary) hypertension: Secondary | ICD-10-CM | POA: Diagnosis not present

## 2018-03-08 DIAGNOSIS — Z9181 History of falling: Secondary | ICD-10-CM | POA: Diagnosis not present

## 2018-03-08 DIAGNOSIS — G629 Polyneuropathy, unspecified: Secondary | ICD-10-CM | POA: Diagnosis not present

## 2018-03-08 DIAGNOSIS — E7800 Pure hypercholesterolemia, unspecified: Secondary | ICD-10-CM | POA: Diagnosis not present

## 2018-03-08 DIAGNOSIS — Z8546 Personal history of malignant neoplasm of prostate: Secondary | ICD-10-CM | POA: Diagnosis not present

## 2018-03-08 DIAGNOSIS — G20 Parkinson's disease: Secondary | ICD-10-CM | POA: Diagnosis not present

## 2018-03-10 DIAGNOSIS — G629 Polyneuropathy, unspecified: Secondary | ICD-10-CM | POA: Diagnosis not present

## 2018-03-10 DIAGNOSIS — H538 Other visual disturbances: Secondary | ICD-10-CM | POA: Diagnosis not present

## 2018-03-10 DIAGNOSIS — K5790 Diverticulosis of intestine, part unspecified, without perforation or abscess without bleeding: Secondary | ICD-10-CM | POA: Diagnosis not present

## 2018-03-10 DIAGNOSIS — M109 Gout, unspecified: Secondary | ICD-10-CM | POA: Diagnosis not present

## 2018-03-10 DIAGNOSIS — G20 Parkinson's disease: Secondary | ICD-10-CM | POA: Diagnosis not present

## 2018-03-10 DIAGNOSIS — I10 Essential (primary) hypertension: Secondary | ICD-10-CM | POA: Diagnosis not present

## 2018-03-13 DIAGNOSIS — M109 Gout, unspecified: Secondary | ICD-10-CM | POA: Diagnosis not present

## 2018-03-13 DIAGNOSIS — I10 Essential (primary) hypertension: Secondary | ICD-10-CM | POA: Diagnosis not present

## 2018-03-13 DIAGNOSIS — H538 Other visual disturbances: Secondary | ICD-10-CM | POA: Diagnosis not present

## 2018-03-13 DIAGNOSIS — G629 Polyneuropathy, unspecified: Secondary | ICD-10-CM | POA: Diagnosis not present

## 2018-03-13 DIAGNOSIS — K5790 Diverticulosis of intestine, part unspecified, without perforation or abscess without bleeding: Secondary | ICD-10-CM | POA: Diagnosis not present

## 2018-03-13 DIAGNOSIS — G20 Parkinson's disease: Secondary | ICD-10-CM | POA: Diagnosis not present

## 2018-03-16 DIAGNOSIS — K5790 Diverticulosis of intestine, part unspecified, without perforation or abscess without bleeding: Secondary | ICD-10-CM | POA: Diagnosis not present

## 2018-03-16 DIAGNOSIS — H538 Other visual disturbances: Secondary | ICD-10-CM | POA: Diagnosis not present

## 2018-03-16 DIAGNOSIS — G629 Polyneuropathy, unspecified: Secondary | ICD-10-CM | POA: Diagnosis not present

## 2018-03-16 DIAGNOSIS — I10 Essential (primary) hypertension: Secondary | ICD-10-CM | POA: Diagnosis not present

## 2018-03-16 DIAGNOSIS — M109 Gout, unspecified: Secondary | ICD-10-CM | POA: Diagnosis not present

## 2018-03-16 DIAGNOSIS — G20 Parkinson's disease: Secondary | ICD-10-CM | POA: Diagnosis not present

## 2018-03-21 DIAGNOSIS — H538 Other visual disturbances: Secondary | ICD-10-CM | POA: Diagnosis not present

## 2018-03-21 DIAGNOSIS — G629 Polyneuropathy, unspecified: Secondary | ICD-10-CM | POA: Diagnosis not present

## 2018-03-21 DIAGNOSIS — M109 Gout, unspecified: Secondary | ICD-10-CM | POA: Diagnosis not present

## 2018-03-21 DIAGNOSIS — K5790 Diverticulosis of intestine, part unspecified, without perforation or abscess without bleeding: Secondary | ICD-10-CM | POA: Diagnosis not present

## 2018-03-21 DIAGNOSIS — G20 Parkinson's disease: Secondary | ICD-10-CM | POA: Diagnosis not present

## 2018-03-21 DIAGNOSIS — I10 Essential (primary) hypertension: Secondary | ICD-10-CM | POA: Diagnosis not present

## 2018-03-22 DIAGNOSIS — G20 Parkinson's disease: Secondary | ICD-10-CM | POA: Diagnosis not present

## 2018-03-22 DIAGNOSIS — I10 Essential (primary) hypertension: Secondary | ICD-10-CM | POA: Diagnosis not present

## 2018-03-22 DIAGNOSIS — K5790 Diverticulosis of intestine, part unspecified, without perforation or abscess without bleeding: Secondary | ICD-10-CM | POA: Diagnosis not present

## 2018-03-22 DIAGNOSIS — M109 Gout, unspecified: Secondary | ICD-10-CM | POA: Diagnosis not present

## 2018-03-22 DIAGNOSIS — H538 Other visual disturbances: Secondary | ICD-10-CM | POA: Diagnosis not present

## 2018-03-22 DIAGNOSIS — G629 Polyneuropathy, unspecified: Secondary | ICD-10-CM | POA: Diagnosis not present

## 2018-03-23 DIAGNOSIS — I10 Essential (primary) hypertension: Secondary | ICD-10-CM | POA: Diagnosis not present

## 2018-03-23 DIAGNOSIS — M109 Gout, unspecified: Secondary | ICD-10-CM | POA: Diagnosis not present

## 2018-03-23 DIAGNOSIS — G20 Parkinson's disease: Secondary | ICD-10-CM | POA: Diagnosis not present

## 2018-03-23 DIAGNOSIS — K5790 Diverticulosis of intestine, part unspecified, without perforation or abscess without bleeding: Secondary | ICD-10-CM | POA: Diagnosis not present

## 2018-03-23 DIAGNOSIS — H538 Other visual disturbances: Secondary | ICD-10-CM | POA: Diagnosis not present

## 2018-03-23 DIAGNOSIS — G629 Polyneuropathy, unspecified: Secondary | ICD-10-CM | POA: Diagnosis not present

## 2018-03-24 DIAGNOSIS — G20 Parkinson's disease: Secondary | ICD-10-CM | POA: Diagnosis not present

## 2018-03-24 DIAGNOSIS — K5790 Diverticulosis of intestine, part unspecified, without perforation or abscess without bleeding: Secondary | ICD-10-CM | POA: Diagnosis not present

## 2018-03-24 DIAGNOSIS — H538 Other visual disturbances: Secondary | ICD-10-CM | POA: Diagnosis not present

## 2018-03-24 DIAGNOSIS — I10 Essential (primary) hypertension: Secondary | ICD-10-CM | POA: Diagnosis not present

## 2018-03-24 DIAGNOSIS — M109 Gout, unspecified: Secondary | ICD-10-CM | POA: Diagnosis not present

## 2018-03-24 DIAGNOSIS — G629 Polyneuropathy, unspecified: Secondary | ICD-10-CM | POA: Diagnosis not present

## 2018-03-29 DIAGNOSIS — M109 Gout, unspecified: Secondary | ICD-10-CM | POA: Diagnosis not present

## 2018-03-29 DIAGNOSIS — G629 Polyneuropathy, unspecified: Secondary | ICD-10-CM | POA: Diagnosis not present

## 2018-03-29 DIAGNOSIS — I10 Essential (primary) hypertension: Secondary | ICD-10-CM | POA: Diagnosis not present

## 2018-03-29 DIAGNOSIS — K5790 Diverticulosis of intestine, part unspecified, without perforation or abscess without bleeding: Secondary | ICD-10-CM | POA: Diagnosis not present

## 2018-03-29 DIAGNOSIS — G20 Parkinson's disease: Secondary | ICD-10-CM | POA: Diagnosis not present

## 2018-03-29 DIAGNOSIS — H538 Other visual disturbances: Secondary | ICD-10-CM | POA: Diagnosis not present

## 2018-03-31 DIAGNOSIS — G629 Polyneuropathy, unspecified: Secondary | ICD-10-CM | POA: Diagnosis not present

## 2018-03-31 DIAGNOSIS — C61 Malignant neoplasm of prostate: Secondary | ICD-10-CM | POA: Diagnosis not present

## 2018-03-31 DIAGNOSIS — N5201 Erectile dysfunction due to arterial insufficiency: Secondary | ICD-10-CM | POA: Diagnosis not present

## 2018-03-31 DIAGNOSIS — I10 Essential (primary) hypertension: Secondary | ICD-10-CM | POA: Diagnosis not present

## 2018-03-31 DIAGNOSIS — M109 Gout, unspecified: Secondary | ICD-10-CM | POA: Diagnosis not present

## 2018-03-31 DIAGNOSIS — H538 Other visual disturbances: Secondary | ICD-10-CM | POA: Diagnosis not present

## 2018-03-31 DIAGNOSIS — D400 Neoplasm of uncertain behavior of prostate: Secondary | ICD-10-CM | POA: Diagnosis not present

## 2018-03-31 DIAGNOSIS — G20 Parkinson's disease: Secondary | ICD-10-CM | POA: Diagnosis not present

## 2018-03-31 DIAGNOSIS — K5790 Diverticulosis of intestine, part unspecified, without perforation or abscess without bleeding: Secondary | ICD-10-CM | POA: Diagnosis not present

## 2018-04-03 DIAGNOSIS — H538 Other visual disturbances: Secondary | ICD-10-CM | POA: Diagnosis not present

## 2018-04-03 DIAGNOSIS — K5790 Diverticulosis of intestine, part unspecified, without perforation or abscess without bleeding: Secondary | ICD-10-CM | POA: Diagnosis not present

## 2018-04-03 DIAGNOSIS — M109 Gout, unspecified: Secondary | ICD-10-CM | POA: Diagnosis not present

## 2018-04-03 DIAGNOSIS — G20 Parkinson's disease: Secondary | ICD-10-CM | POA: Diagnosis not present

## 2018-04-03 DIAGNOSIS — G629 Polyneuropathy, unspecified: Secondary | ICD-10-CM | POA: Diagnosis not present

## 2018-04-03 DIAGNOSIS — I10 Essential (primary) hypertension: Secondary | ICD-10-CM | POA: Diagnosis not present

## 2018-04-05 DIAGNOSIS — G20 Parkinson's disease: Secondary | ICD-10-CM | POA: Diagnosis not present

## 2018-04-05 DIAGNOSIS — M109 Gout, unspecified: Secondary | ICD-10-CM | POA: Diagnosis not present

## 2018-04-05 DIAGNOSIS — G629 Polyneuropathy, unspecified: Secondary | ICD-10-CM | POA: Diagnosis not present

## 2018-04-05 DIAGNOSIS — I10 Essential (primary) hypertension: Secondary | ICD-10-CM | POA: Diagnosis not present

## 2018-04-05 DIAGNOSIS — K5790 Diverticulosis of intestine, part unspecified, without perforation or abscess without bleeding: Secondary | ICD-10-CM | POA: Diagnosis not present

## 2018-04-05 DIAGNOSIS — H538 Other visual disturbances: Secondary | ICD-10-CM | POA: Diagnosis not present

## 2018-04-06 DIAGNOSIS — K5790 Diverticulosis of intestine, part unspecified, without perforation or abscess without bleeding: Secondary | ICD-10-CM | POA: Diagnosis not present

## 2018-04-06 DIAGNOSIS — M109 Gout, unspecified: Secondary | ICD-10-CM | POA: Diagnosis not present

## 2018-04-06 DIAGNOSIS — H538 Other visual disturbances: Secondary | ICD-10-CM | POA: Diagnosis not present

## 2018-04-06 DIAGNOSIS — G629 Polyneuropathy, unspecified: Secondary | ICD-10-CM | POA: Diagnosis not present

## 2018-04-06 DIAGNOSIS — I10 Essential (primary) hypertension: Secondary | ICD-10-CM | POA: Diagnosis not present

## 2018-04-06 DIAGNOSIS — G20 Parkinson's disease: Secondary | ICD-10-CM | POA: Diagnosis not present

## 2018-04-19 DIAGNOSIS — G20 Parkinson's disease: Secondary | ICD-10-CM | POA: Diagnosis not present

## 2018-04-19 DIAGNOSIS — I10 Essential (primary) hypertension: Secondary | ICD-10-CM | POA: Diagnosis not present

## 2018-04-19 DIAGNOSIS — R413 Other amnesia: Secondary | ICD-10-CM | POA: Diagnosis not present

## 2018-04-19 DIAGNOSIS — H538 Other visual disturbances: Secondary | ICD-10-CM | POA: Diagnosis not present

## 2018-07-19 DIAGNOSIS — H538 Other visual disturbances: Secondary | ICD-10-CM | POA: Diagnosis not present

## 2018-07-19 DIAGNOSIS — I10 Essential (primary) hypertension: Secondary | ICD-10-CM | POA: Diagnosis not present

## 2018-07-19 DIAGNOSIS — G20 Parkinson's disease: Secondary | ICD-10-CM | POA: Diagnosis not present

## 2018-07-19 DIAGNOSIS — R413 Other amnesia: Secondary | ICD-10-CM | POA: Diagnosis not present

## 2018-07-25 DIAGNOSIS — Z9181 History of falling: Secondary | ICD-10-CM | POA: Diagnosis not present

## 2018-07-25 DIAGNOSIS — E7800 Pure hypercholesterolemia, unspecified: Secondary | ICD-10-CM | POA: Diagnosis not present

## 2018-07-25 DIAGNOSIS — M109 Gout, unspecified: Secondary | ICD-10-CM | POA: Diagnosis not present

## 2018-07-25 DIAGNOSIS — I10 Essential (primary) hypertension: Secondary | ICD-10-CM | POA: Diagnosis not present

## 2018-07-25 DIAGNOSIS — Z7982 Long term (current) use of aspirin: Secondary | ICD-10-CM | POA: Diagnosis not present

## 2018-07-25 DIAGNOSIS — H538 Other visual disturbances: Secondary | ICD-10-CM | POA: Diagnosis not present

## 2018-07-25 DIAGNOSIS — G20 Parkinson's disease: Secondary | ICD-10-CM | POA: Diagnosis not present

## 2018-07-25 DIAGNOSIS — Z8546 Personal history of malignant neoplasm of prostate: Secondary | ICD-10-CM | POA: Diagnosis not present

## 2018-07-25 DIAGNOSIS — K5790 Diverticulosis of intestine, part unspecified, without perforation or abscess without bleeding: Secondary | ICD-10-CM | POA: Diagnosis not present

## 2018-07-31 DIAGNOSIS — H538 Other visual disturbances: Secondary | ICD-10-CM | POA: Diagnosis not present

## 2018-07-31 DIAGNOSIS — E7800 Pure hypercholesterolemia, unspecified: Secondary | ICD-10-CM | POA: Diagnosis not present

## 2018-07-31 DIAGNOSIS — K5790 Diverticulosis of intestine, part unspecified, without perforation or abscess without bleeding: Secondary | ICD-10-CM | POA: Diagnosis not present

## 2018-07-31 DIAGNOSIS — I10 Essential (primary) hypertension: Secondary | ICD-10-CM | POA: Diagnosis not present

## 2018-07-31 DIAGNOSIS — G20 Parkinson's disease: Secondary | ICD-10-CM | POA: Diagnosis not present

## 2018-07-31 DIAGNOSIS — M109 Gout, unspecified: Secondary | ICD-10-CM | POA: Diagnosis not present

## 2018-08-01 DIAGNOSIS — H538 Other visual disturbances: Secondary | ICD-10-CM | POA: Diagnosis not present

## 2018-08-01 DIAGNOSIS — I10 Essential (primary) hypertension: Secondary | ICD-10-CM | POA: Diagnosis not present

## 2018-08-01 DIAGNOSIS — R413 Other amnesia: Secondary | ICD-10-CM | POA: Diagnosis not present

## 2018-08-01 DIAGNOSIS — G20 Parkinson's disease: Secondary | ICD-10-CM | POA: Diagnosis not present

## 2018-08-02 DIAGNOSIS — K5790 Diverticulosis of intestine, part unspecified, without perforation or abscess without bleeding: Secondary | ICD-10-CM | POA: Diagnosis not present

## 2018-08-02 DIAGNOSIS — H538 Other visual disturbances: Secondary | ICD-10-CM | POA: Diagnosis not present

## 2018-08-02 DIAGNOSIS — I10 Essential (primary) hypertension: Secondary | ICD-10-CM | POA: Diagnosis not present

## 2018-08-02 DIAGNOSIS — G20 Parkinson's disease: Secondary | ICD-10-CM | POA: Diagnosis not present

## 2018-08-02 DIAGNOSIS — M109 Gout, unspecified: Secondary | ICD-10-CM | POA: Diagnosis not present

## 2018-08-02 DIAGNOSIS — E7800 Pure hypercholesterolemia, unspecified: Secondary | ICD-10-CM | POA: Diagnosis not present

## 2018-08-03 DIAGNOSIS — M12812 Other specific arthropathies, not elsewhere classified, left shoulder: Secondary | ICD-10-CM | POA: Diagnosis not present

## 2018-08-03 DIAGNOSIS — M19012 Primary osteoarthritis, left shoulder: Secondary | ICD-10-CM | POA: Diagnosis not present

## 2018-08-03 DIAGNOSIS — M25512 Pain in left shoulder: Secondary | ICD-10-CM | POA: Diagnosis not present

## 2018-08-03 DIAGNOSIS — G8929 Other chronic pain: Secondary | ICD-10-CM | POA: Diagnosis not present

## 2018-08-04 DIAGNOSIS — I10 Essential (primary) hypertension: Secondary | ICD-10-CM | POA: Diagnosis not present

## 2018-08-04 DIAGNOSIS — H538 Other visual disturbances: Secondary | ICD-10-CM | POA: Diagnosis not present

## 2018-08-04 DIAGNOSIS — G20 Parkinson's disease: Secondary | ICD-10-CM | POA: Diagnosis not present

## 2018-08-04 DIAGNOSIS — M109 Gout, unspecified: Secondary | ICD-10-CM | POA: Diagnosis not present

## 2018-08-04 DIAGNOSIS — E7800 Pure hypercholesterolemia, unspecified: Secondary | ICD-10-CM | POA: Diagnosis not present

## 2018-08-04 DIAGNOSIS — K5790 Diverticulosis of intestine, part unspecified, without perforation or abscess without bleeding: Secondary | ICD-10-CM | POA: Diagnosis not present

## 2018-08-07 DIAGNOSIS — C61 Malignant neoplasm of prostate: Secondary | ICD-10-CM | POA: Diagnosis not present

## 2018-08-07 DIAGNOSIS — H538 Other visual disturbances: Secondary | ICD-10-CM | POA: Diagnosis not present

## 2018-08-07 DIAGNOSIS — G20 Parkinson's disease: Secondary | ICD-10-CM | POA: Diagnosis not present

## 2018-08-07 DIAGNOSIS — K5790 Diverticulosis of intestine, part unspecified, without perforation or abscess without bleeding: Secondary | ICD-10-CM | POA: Diagnosis not present

## 2018-08-07 DIAGNOSIS — D400 Neoplasm of uncertain behavior of prostate: Secondary | ICD-10-CM | POA: Diagnosis not present

## 2018-08-07 DIAGNOSIS — E7800 Pure hypercholesterolemia, unspecified: Secondary | ICD-10-CM | POA: Diagnosis not present

## 2018-08-07 DIAGNOSIS — I10 Essential (primary) hypertension: Secondary | ICD-10-CM | POA: Diagnosis not present

## 2018-08-07 DIAGNOSIS — M109 Gout, unspecified: Secondary | ICD-10-CM | POA: Diagnosis not present

## 2018-08-09 DIAGNOSIS — M109 Gout, unspecified: Secondary | ICD-10-CM | POA: Diagnosis not present

## 2018-08-09 DIAGNOSIS — K5790 Diverticulosis of intestine, part unspecified, without perforation or abscess without bleeding: Secondary | ICD-10-CM | POA: Diagnosis not present

## 2018-08-09 DIAGNOSIS — I10 Essential (primary) hypertension: Secondary | ICD-10-CM | POA: Diagnosis not present

## 2018-08-09 DIAGNOSIS — E7800 Pure hypercholesterolemia, unspecified: Secondary | ICD-10-CM | POA: Diagnosis not present

## 2018-08-09 DIAGNOSIS — G20 Parkinson's disease: Secondary | ICD-10-CM | POA: Diagnosis not present

## 2018-08-09 DIAGNOSIS — H538 Other visual disturbances: Secondary | ICD-10-CM | POA: Diagnosis not present

## 2018-08-11 DIAGNOSIS — M19012 Primary osteoarthritis, left shoulder: Secondary | ICD-10-CM | POA: Diagnosis not present

## 2018-08-11 DIAGNOSIS — M109 Gout, unspecified: Secondary | ICD-10-CM | POA: Diagnosis not present

## 2018-08-11 DIAGNOSIS — E7800 Pure hypercholesterolemia, unspecified: Secondary | ICD-10-CM | POA: Diagnosis not present

## 2018-08-11 DIAGNOSIS — G20 Parkinson's disease: Secondary | ICD-10-CM | POA: Diagnosis not present

## 2018-08-11 DIAGNOSIS — M12812 Other specific arthropathies, not elsewhere classified, left shoulder: Secondary | ICD-10-CM | POA: Diagnosis not present

## 2018-08-11 DIAGNOSIS — I10 Essential (primary) hypertension: Secondary | ICD-10-CM | POA: Diagnosis not present

## 2018-08-11 DIAGNOSIS — H538 Other visual disturbances: Secondary | ICD-10-CM | POA: Diagnosis not present

## 2018-08-11 DIAGNOSIS — M25512 Pain in left shoulder: Secondary | ICD-10-CM | POA: Diagnosis not present

## 2018-08-11 DIAGNOSIS — G8929 Other chronic pain: Secondary | ICD-10-CM | POA: Diagnosis not present

## 2018-08-11 DIAGNOSIS — K5790 Diverticulosis of intestine, part unspecified, without perforation or abscess without bleeding: Secondary | ICD-10-CM | POA: Diagnosis not present

## 2018-08-14 DIAGNOSIS — H538 Other visual disturbances: Secondary | ICD-10-CM | POA: Diagnosis not present

## 2018-08-14 DIAGNOSIS — M109 Gout, unspecified: Secondary | ICD-10-CM | POA: Diagnosis not present

## 2018-08-14 DIAGNOSIS — K5790 Diverticulosis of intestine, part unspecified, without perforation or abscess without bleeding: Secondary | ICD-10-CM | POA: Diagnosis not present

## 2018-08-14 DIAGNOSIS — G20 Parkinson's disease: Secondary | ICD-10-CM | POA: Diagnosis not present

## 2018-08-14 DIAGNOSIS — E7800 Pure hypercholesterolemia, unspecified: Secondary | ICD-10-CM | POA: Diagnosis not present

## 2018-08-14 DIAGNOSIS — I10 Essential (primary) hypertension: Secondary | ICD-10-CM | POA: Diagnosis not present

## 2018-08-16 DIAGNOSIS — E7800 Pure hypercholesterolemia, unspecified: Secondary | ICD-10-CM | POA: Diagnosis not present

## 2018-08-16 DIAGNOSIS — K5790 Diverticulosis of intestine, part unspecified, without perforation or abscess without bleeding: Secondary | ICD-10-CM | POA: Diagnosis not present

## 2018-08-16 DIAGNOSIS — H538 Other visual disturbances: Secondary | ICD-10-CM | POA: Diagnosis not present

## 2018-08-16 DIAGNOSIS — M109 Gout, unspecified: Secondary | ICD-10-CM | POA: Diagnosis not present

## 2018-08-16 DIAGNOSIS — I10 Essential (primary) hypertension: Secondary | ICD-10-CM | POA: Diagnosis not present

## 2018-08-16 DIAGNOSIS — G20 Parkinson's disease: Secondary | ICD-10-CM | POA: Diagnosis not present

## 2018-08-17 DIAGNOSIS — N472 Paraphimosis: Secondary | ICD-10-CM | POA: Diagnosis not present

## 2018-08-17 DIAGNOSIS — N471 Phimosis: Secondary | ICD-10-CM | POA: Diagnosis not present

## 2018-08-21 DIAGNOSIS — H538 Other visual disturbances: Secondary | ICD-10-CM | POA: Diagnosis not present

## 2018-08-21 DIAGNOSIS — M109 Gout, unspecified: Secondary | ICD-10-CM | POA: Diagnosis not present

## 2018-08-21 DIAGNOSIS — E7800 Pure hypercholesterolemia, unspecified: Secondary | ICD-10-CM | POA: Diagnosis not present

## 2018-08-21 DIAGNOSIS — I10 Essential (primary) hypertension: Secondary | ICD-10-CM | POA: Diagnosis not present

## 2018-08-21 DIAGNOSIS — K5790 Diverticulosis of intestine, part unspecified, without perforation or abscess without bleeding: Secondary | ICD-10-CM | POA: Diagnosis not present

## 2018-08-21 DIAGNOSIS — G20 Parkinson's disease: Secondary | ICD-10-CM | POA: Diagnosis not present

## 2018-08-24 DIAGNOSIS — H538 Other visual disturbances: Secondary | ICD-10-CM | POA: Diagnosis not present

## 2018-08-24 DIAGNOSIS — E7800 Pure hypercholesterolemia, unspecified: Secondary | ICD-10-CM | POA: Diagnosis not present

## 2018-08-24 DIAGNOSIS — K5790 Diverticulosis of intestine, part unspecified, without perforation or abscess without bleeding: Secondary | ICD-10-CM | POA: Diagnosis not present

## 2018-08-24 DIAGNOSIS — G20 Parkinson's disease: Secondary | ICD-10-CM | POA: Diagnosis not present

## 2018-08-24 DIAGNOSIS — Z9181 History of falling: Secondary | ICD-10-CM | POA: Diagnosis not present

## 2018-08-24 DIAGNOSIS — Z7982 Long term (current) use of aspirin: Secondary | ICD-10-CM | POA: Diagnosis not present

## 2018-08-24 DIAGNOSIS — Z8546 Personal history of malignant neoplasm of prostate: Secondary | ICD-10-CM | POA: Diagnosis not present

## 2018-08-24 DIAGNOSIS — I10 Essential (primary) hypertension: Secondary | ICD-10-CM | POA: Diagnosis not present

## 2018-08-24 DIAGNOSIS — M109 Gout, unspecified: Secondary | ICD-10-CM | POA: Diagnosis not present

## 2018-08-28 DIAGNOSIS — K5790 Diverticulosis of intestine, part unspecified, without perforation or abscess without bleeding: Secondary | ICD-10-CM | POA: Diagnosis not present

## 2018-08-28 DIAGNOSIS — G20 Parkinson's disease: Secondary | ICD-10-CM | POA: Diagnosis not present

## 2018-08-28 DIAGNOSIS — H538 Other visual disturbances: Secondary | ICD-10-CM | POA: Diagnosis not present

## 2018-08-28 DIAGNOSIS — E7800 Pure hypercholesterolemia, unspecified: Secondary | ICD-10-CM | POA: Diagnosis not present

## 2018-08-28 DIAGNOSIS — I10 Essential (primary) hypertension: Secondary | ICD-10-CM | POA: Diagnosis not present

## 2018-08-28 DIAGNOSIS — M109 Gout, unspecified: Secondary | ICD-10-CM | POA: Diagnosis not present

## 2018-08-29 DIAGNOSIS — Z961 Presence of intraocular lens: Secondary | ICD-10-CM | POA: Diagnosis not present

## 2018-08-31 DIAGNOSIS — E7800 Pure hypercholesterolemia, unspecified: Secondary | ICD-10-CM | POA: Diagnosis not present

## 2018-08-31 DIAGNOSIS — K5790 Diverticulosis of intestine, part unspecified, without perforation or abscess without bleeding: Secondary | ICD-10-CM | POA: Diagnosis not present

## 2018-08-31 DIAGNOSIS — H538 Other visual disturbances: Secondary | ICD-10-CM | POA: Diagnosis not present

## 2018-08-31 DIAGNOSIS — G20 Parkinson's disease: Secondary | ICD-10-CM | POA: Diagnosis not present

## 2018-08-31 DIAGNOSIS — I10 Essential (primary) hypertension: Secondary | ICD-10-CM | POA: Diagnosis not present

## 2018-08-31 DIAGNOSIS — M109 Gout, unspecified: Secondary | ICD-10-CM | POA: Diagnosis not present

## 2018-09-06 DIAGNOSIS — K5790 Diverticulosis of intestine, part unspecified, without perforation or abscess without bleeding: Secondary | ICD-10-CM | POA: Diagnosis not present

## 2018-09-06 DIAGNOSIS — E7800 Pure hypercholesterolemia, unspecified: Secondary | ICD-10-CM | POA: Diagnosis not present

## 2018-09-06 DIAGNOSIS — G20 Parkinson's disease: Secondary | ICD-10-CM | POA: Diagnosis not present

## 2018-09-06 DIAGNOSIS — I10 Essential (primary) hypertension: Secondary | ICD-10-CM | POA: Diagnosis not present

## 2018-09-06 DIAGNOSIS — M109 Gout, unspecified: Secondary | ICD-10-CM | POA: Diagnosis not present

## 2018-09-06 DIAGNOSIS — H538 Other visual disturbances: Secondary | ICD-10-CM | POA: Diagnosis not present

## 2018-09-13 DIAGNOSIS — G20 Parkinson's disease: Secondary | ICD-10-CM | POA: Diagnosis not present

## 2018-09-13 DIAGNOSIS — E7800 Pure hypercholesterolemia, unspecified: Secondary | ICD-10-CM | POA: Diagnosis not present

## 2018-09-13 DIAGNOSIS — M109 Gout, unspecified: Secondary | ICD-10-CM | POA: Diagnosis not present

## 2018-09-13 DIAGNOSIS — K5790 Diverticulosis of intestine, part unspecified, without perforation or abscess without bleeding: Secondary | ICD-10-CM | POA: Diagnosis not present

## 2018-09-13 DIAGNOSIS — I10 Essential (primary) hypertension: Secondary | ICD-10-CM | POA: Diagnosis not present

## 2018-09-13 DIAGNOSIS — H538 Other visual disturbances: Secondary | ICD-10-CM | POA: Diagnosis not present

## 2018-09-18 DIAGNOSIS — E7800 Pure hypercholesterolemia, unspecified: Secondary | ICD-10-CM | POA: Diagnosis not present

## 2018-09-18 DIAGNOSIS — G20 Parkinson's disease: Secondary | ICD-10-CM | POA: Diagnosis not present

## 2018-09-18 DIAGNOSIS — M109 Gout, unspecified: Secondary | ICD-10-CM | POA: Diagnosis not present

## 2018-09-18 DIAGNOSIS — K5790 Diverticulosis of intestine, part unspecified, without perforation or abscess without bleeding: Secondary | ICD-10-CM | POA: Diagnosis not present

## 2018-09-18 DIAGNOSIS — H538 Other visual disturbances: Secondary | ICD-10-CM | POA: Diagnosis not present

## 2018-09-18 DIAGNOSIS — I10 Essential (primary) hypertension: Secondary | ICD-10-CM | POA: Diagnosis not present

## 2018-09-24 ENCOUNTER — Other Ambulatory Visit: Payer: Self-pay | Admitting: Family Medicine

## 2018-10-31 DIAGNOSIS — G20 Parkinson's disease: Secondary | ICD-10-CM | POA: Diagnosis not present

## 2018-10-31 DIAGNOSIS — R413 Other amnesia: Secondary | ICD-10-CM | POA: Diagnosis not present

## 2018-10-31 DIAGNOSIS — I10 Essential (primary) hypertension: Secondary | ICD-10-CM | POA: Diagnosis not present

## 2019-01-01 DIAGNOSIS — R413 Other amnesia: Secondary | ICD-10-CM | POA: Diagnosis not present

## 2019-01-01 DIAGNOSIS — G231 Progressive supranuclear ophthalmoplegia [Steele-Richardson-Olszewski]: Secondary | ICD-10-CM | POA: Diagnosis not present

## 2019-01-01 DIAGNOSIS — I10 Essential (primary) hypertension: Secondary | ICD-10-CM | POA: Diagnosis not present

## 2019-01-01 DIAGNOSIS — H539 Unspecified visual disturbance: Secondary | ICD-10-CM | POA: Diagnosis not present

## 2019-01-09 ENCOUNTER — Other Ambulatory Visit: Payer: Self-pay

## 2019-01-09 ENCOUNTER — Ambulatory Visit (INDEPENDENT_AMBULATORY_CARE_PROVIDER_SITE_OTHER): Payer: Medicare Other

## 2019-01-09 DIAGNOSIS — Z23 Encounter for immunization: Secondary | ICD-10-CM | POA: Diagnosis not present

## 2019-02-08 ENCOUNTER — Ambulatory Visit: Payer: Medicare Other

## 2019-02-09 ENCOUNTER — Other Ambulatory Visit: Payer: Self-pay

## 2019-02-13 ENCOUNTER — Other Ambulatory Visit: Payer: Self-pay | Admitting: Family Medicine

## 2019-02-13 DIAGNOSIS — M109 Gout, unspecified: Secondary | ICD-10-CM

## 2019-02-13 DIAGNOSIS — Z125 Encounter for screening for malignant neoplasm of prostate: Secondary | ICD-10-CM

## 2019-02-13 DIAGNOSIS — C61 Malignant neoplasm of prostate: Secondary | ICD-10-CM

## 2019-02-13 DIAGNOSIS — E785 Hyperlipidemia, unspecified: Secondary | ICD-10-CM

## 2019-02-14 ENCOUNTER — Other Ambulatory Visit (INDEPENDENT_AMBULATORY_CARE_PROVIDER_SITE_OTHER): Payer: Medicare Other

## 2019-02-14 ENCOUNTER — Ambulatory Visit (INDEPENDENT_AMBULATORY_CARE_PROVIDER_SITE_OTHER): Payer: Medicare Other

## 2019-02-14 DIAGNOSIS — C61 Malignant neoplasm of prostate: Secondary | ICD-10-CM

## 2019-02-14 DIAGNOSIS — M109 Gout, unspecified: Secondary | ICD-10-CM

## 2019-02-14 DIAGNOSIS — Z125 Encounter for screening for malignant neoplasm of prostate: Secondary | ICD-10-CM | POA: Diagnosis not present

## 2019-02-14 DIAGNOSIS — E785 Hyperlipidemia, unspecified: Secondary | ICD-10-CM | POA: Diagnosis not present

## 2019-02-14 DIAGNOSIS — Z0000 Encounter for general adult medical examination without abnormal findings: Secondary | ICD-10-CM | POA: Diagnosis not present

## 2019-02-14 LAB — COMPREHENSIVE METABOLIC PANEL
ALT: 14 U/L (ref 0–53)
AST: 15 U/L (ref 0–37)
Albumin: 4.1 g/dL (ref 3.5–5.2)
Alkaline Phosphatase: 93 U/L (ref 39–117)
BUN: 18 mg/dL (ref 6–23)
CO2: 30 mEq/L (ref 19–32)
Calcium: 9.4 mg/dL (ref 8.4–10.5)
Chloride: 102 mEq/L (ref 96–112)
Creatinine, Ser: 1.05 mg/dL (ref 0.40–1.50)
GFR: 69.58 mL/min (ref >60.00)
Glucose, Bld: 99 mg/dL (ref 70–99)
Potassium: 4.2 mEq/L (ref 3.5–5.1)
Sodium: 142 mEq/L (ref 135–145)
Total Bilirubin: 0.5 mg/dL (ref 0.2–1.2)
Total Protein: 6.7 g/dL (ref 6.0–8.3)

## 2019-02-14 LAB — URIC ACID: Uric Acid, Serum: 7.1 mg/dL (ref 4.0–7.8)

## 2019-02-14 LAB — LIPID PANEL
Cholesterol: 164 mg/dL (ref 0–200)
HDL: 57.5 mg/dL (ref >39.00)
LDL Cholesterol: 86 mg/dL (ref 0–99)
NonHDL: 106.37
Total CHOL/HDL Ratio: 3
Triglycerides: 102 mg/dL (ref 0.0–149.0)
VLDL: 20.4 mg/dL (ref 0.0–40.0)

## 2019-02-14 LAB — PSA, MEDICARE: PSA: 0.08 ng/ml — ABNORMAL LOW (ref 0.10–4.00)

## 2019-02-14 NOTE — Progress Notes (Signed)
PCP notes:  Health Maintenance: Will check with insurance regarding shingrix   Abnormal Screenings: none   Patient concerns: none   Nurse concerns: none   Next PCP appt.: 02/22/2019 @ 10:45 am

## 2019-02-14 NOTE — Progress Notes (Signed)
Subjective:   Tristan Booker is a 73 y.o. male who presents for Medicare Annual/Subsequent preventive examination.  Review of Systems: N/A   This visit is being conducted through telemedicine via telephone at the nurse health advisor's home address due to the COVID-19 pandemic. This patient has given me verbal consent via doximity to conduct this visit, patient states they are participating from their home address. Patient and myself are on the telephone call. There is no referral for this visit. Some vital signs may be absent or patient reported.    Patient identification: identified by name, DOB, and current address   Cardiac Risk Factors include: advanced age (>16men, >101 women);hypertension;dyslipidemia;male gender     Objective:    Vitals: There were no vitals taken for this visit.  There is no height or weight on file to calculate BMI.  Advanced Directives 02/14/2019 02/03/2018 02/02/2017 03/10/2016 01/29/2016 02/06/2015  Does Patient Have a Medical Advance Directive? No No No No No Yes  Does patient want to make changes to medical advance directive? - No - Patient declined - - - -  Copy of [place] in Chart? - - - - - No - copy requested  Would patient like information on creating a medical advance directive? No - Patient declined - Yes (MAU/Ambulatory/Procedural Areas - Information given) - Yes - Educational materials given -    Tobacco Social History   Tobacco Use  Smoking Status Never Smoker  Smokeless Tobacco Former Engineer, structural given: Not Answered   Clinical Intake:  Pre-visit preparation completed: Yes  Pain : No/denies pain     Nutritional Risks: None Diabetes: No  How often do you need to have someone help you when you read instructions, pamphlets, or other written materials from your doctor or pharmacy?: 1 - Never What is the last grade level you completed in school?: 12th  Interpreter Needed?: No  Information entered by  :: CJohnson, LPN  Past Medical History:  Diagnosis Date  . Gout   . Hyperlipidemia   . Hypertension   . Parkinson disease ([place])   . Prostate cancer [facility])    Past Surgical History:  Procedure Laterality Date  . CATARACT EXTRACTION  12/2004   right  . CATARACT EXTRACTION  02/2005   left  . COLONOSCOPY WITH PROPOFOL N/A 02/06/2015   Procedure: COLONOSCOPY WITH PROPOFOL;  Surgeon: Manya Silvas, MD;  Location: [facility] ENDOSCOPY;  Service: Endoscopy;  Laterality: N/A;  . HERNIA REPAIR  1974   left  . PROSTATE BIOPSY  02/27/2002   negative x 6  . PROSTATECTOMY  05/25/09   Dr. Rogers Blocker   Family History  Problem Relation Age of Onset  . Cancer Mother        breast, s/p mastectomy 1970's  . Heart failure Mother   . Dementia Mother   . Emphysema Father        smoker  . COPD Father        Past smoker  . Heart failure Father   . Arthritis Brother   . Colon cancer Neg Hx   . Prostate cancer Neg Hx    Social History   Socioeconomic History  . Marital status: Married    Spouse name: Not on file  . Number of children: 3  . Years of education: Not on file  . Highest education level: Not on file  Occupational History  . Occupation: vinyl siding install, window replacement    Comment: part time  Social Needs  . Financial resource strain: Not hard at all  . Food insecurity    Worry: Never true    Inability: Never true  . Transportation needs    Medical: No    Non-medical: No  Tobacco Use  . Smoking status: Never Smoker  . Smokeless tobacco: Former Network engineer and Sexual Activity  . Alcohol use: Yes    Alcohol/week: 0.0 standard drinks    Comment: occ  . Drug use: No  . Sexual activity: Yes  Lifestyle  . Physical activity    Days per week: 0 days    Minutes per session: 0 min  . Stress: To some extent  Relationships  . Social Herbalist on phone: Not on file    Gets together: Not on file    Attends religious service: Not on file    Active member of  club or organization: Not on file    Attends meetings of clubs or organizations: Not on file    Relationship status: Not on file  Other Topics Concern  . 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 aspirin 81 MG tablet Take 81 mg by mouth daily.    . cholecalciferol (VITAMIN D) 1000 units tablet Take 1,000 Units by mouth daily.  Marland Kitchen COLCRYS 0.6 MG tablet TAKE ONE TABLET BY MOUTH ONCE DAILY AS NEEDED FOR GOUT  . divalproex (DEPAKOTE) 125 MG DR tablet Take 125 mg twice a day  . docusate sodium (COLACE) 100 MG capsule Take 100 mg by mouth daily as needed.   . donepezil (ARICEPT) 5 MG tablet Take 5 mg by mouth at bedtime.  . hydrochlorothiazide (MICROZIDE) 12.5 MG capsule Take 1 capsule (12.5 mg total) by mouth every morning.  . ranitidine (ZANTAC) 75 MG tablet As needed.   . simvastatin (ZOCOR) 40 MG tablet Take 1 tablet (40 mg total) by mouth at bedtime.  Marland Kitchen terazosin (HYTRIN) 2 MG capsule Take 1 capsule (2 mg total) by mouth daily.  . vitamin B-12 (CYANOCOBALAMIN) 1000 MCG tablet Take 1,000 mcg by mouth daily.  . carbidopa-levodopa (SINEMET IR) 25-100 MG tablet Take 1 tablet by mouth 3 (three) times daily.  Marland Kitchen doxycycline (VIBRA-TABS) 100 MG tablet Take 1 tablet (100 mg total) by mouth 2 (two) times daily. (Patient not taking: Reported on 02/14/2019)   No facility-administered encounter medications on file as of 02/14/2019.     Activities of Daily Living In your present state of health, do you have any difficulty performing the following activities: 02/14/2019  Hearing? N  Vision? Y  Comment vision is worsening  Difficulty concentrating or making decisions? N  Walking or climbing stairs? Y  Comment patient has difficulty  Dressing or bathing? Y  Doing errands, shopping? Y  Preparing Food and eating ? N   Using the Toilet? N  In the past six months, have you accidently leaked urine? N  Do you have problems with loss of bowel control? N  Managing your Medications? N  Managing your Finances? Y  Housekeeping or managing your Housekeeping? Y  Some recent data might be hidden    Patient Care Team: Tonia Ghent, MD as PCP - General (Family Medicine) Dingeldein, Remo Lipps, MD as Consulting Physician (Ophthalmology) Anabel Bene, MD as Referring  Physician (Neurology) Royston Cowper, MD as Consulting Physician (Urology)   Assessment:   This is a routine wellness examination for [facility].  Exercise Activities and Dietary recommendations Current Exercise Habits: Home exercise routine, Type of exercise: Other - see comments, Time (Minutes): 30, Frequency (Times/Week): 3, Weekly Exercise (Minutes/Week): 90, Intensity: Mild, Exercise limited by: None identified  Goals    . Patient Stated     Starting 02/03/2018, I will continue to walk at least 15 minutes daily.     . Patient Stated     02/14/2019, I will maintain and continue medications as prescribed.        Fall Risk Fall Risk  02/14/2019 02/08/2018 02/03/2018 02/03/2018 02/02/2017  Falls in the past year? 0 0 1 0 No  Comment - Emmi Telephone Survey: data to providers prior to load 3 falls secondary to Parkinson's disease - -  Number falls in past yr: 0 - 1 - -  Injury with Fall? 0 - 0 - -  Risk for fall due to : Medication side effect;Impaired balance/gait - - - -  Follow up Falls evaluation completed;Falls prevention discussed - - - -   Is the patient's home free of loose throw rugs in walkways, pet beds, electrical cords, etc?   yes      Grab bars in the bathroom? no      Handrails on the stairs?   no      Adequate lighting?   yes  Timed Get Up and Go Performed: N/A  Depression Screen PHQ 2/9 Scores 02/14/2019 02/03/2018 02/03/2018 02/02/2017  PHQ - 2 Score 0 2 0 0  PHQ- 9 Score 0 5 0 0    Cognitive Function MMSE -  Mini Mental State Exam 02/14/2019 02/03/2018 02/02/2017 01/29/2016  Orientation to time 5 5 5 5   Orientation to Place 5 5 5 5   Registration 3 3 3 3   Attention/ Calculation 5 0 0 0  Recall 3 3 3 3   Language- name 2 objects - 0 0 0  Language- repeat 1 1 1 1   Language- follow 3 step command - 3 3 3   Language- read & follow direction - 0 0 0  Write a sentence - 0 0 0  Copy design - 0 0 0  Total score - 20 20 20   Mini Cog  Mini-Cog screen was completed. Maximum score is 22. A value of 0 denotes this part of the MMSE was not completed or the patient failed this part of the Mini-Cog screening.      Immunization History  Administered Date(s) Administered  . Fluad Quad(high Dose 65+) 01/09/2019  . Influenza Split 01/20/2011, 12/28/2011  . Influenza Whole 12/31/2005, 01/14/2010  . Influenza, Seasonal, Injecte, Preservative Fre 12/05/2015  . Influenza,inj,Quad PF,6+ Mos 01/01/2013, 02/02/2017, 12/22/2017  . Influenza-Unspecified 01/22/2015  . Pneumococcal Conjugate-13 01/29/2016  . Pneumococcal Polysaccharide-23 02/02/2017  . Td 09/19/1996, 11/30/2007  . Zoster 02/21/2013    Qualifies for Shingles Vaccine? Yes  Screening Tests Health Maintenance  Topic Date Due  . TETANUS/TDAP  03/22/2019 (Originally 11/29/2017)  . COLONOSCOPY  02/06/2020  . INFLUENZA VACCINE  Completed  . Hepatitis C Screening  Completed  . PNA vac Low Risk Adult  Completed   Cancer Screenings: Lung: Low Dose CT Chest recommended if Age 68-80 years, 30 pack-year currently smoking OR have quit w/in 15years. Patient does not qualify. Colorectal: completed 02/06/2015  Additional Screenings:  Hepatitis C Screening: 01/29/2016      Plan:   Patient will maintain  and continue medications as prescribed.   I have personally reviewed and noted the following in the patient's chart:   . Medical and social history . Use of alcohol, tobacco or illicit drugs  . Current medications and supplements . Functional ability  and status . Nutritional status . Physical activity . Advanced directives . List of other physicians . Hospitalizations, surgeries, and ER visits in previous 12 months . Vitals . Screenings to include cognitive, depression, and falls . Referrals and appointments  In addition, I have reviewed and discussed with patient certain preventive protocols, quality metrics, and best practice recommendations. A written personalized care plan for preventive services as well as general preventive health recommendations were provided to patient.     Andrez Grime, LPN  [id number]

## 2019-02-14 NOTE — Patient Instructions (Signed)
Tristan Booker , Thank you for taking time to come for your Medicare Wellness Visit. I appreciate your ongoing commitment to your health goals. Please review the following plan we discussed and let me know if I can assist you in the future.   Screening recommendations/referrals: Colonoscopy: Up to date, completed 02/06/2015 Recommended yearly ophthalmology/optometry visit for glaucoma screening and checkup Recommended yearly dental visit for hygiene and checkup  Vaccinations: Influenza vaccine: Up to date, completed 01/09/2019 Pneumococcal vaccine: Completed series Tdap vaccine: decline Shingles vaccine: will check with insurance     Advanced directives: Advance directive discussed with you today. Even though you declined this today please call our office should you change your mind and we can give you the proper paperwork for you to fill out.  Conditions/risks identified: hypertension, hyperlipidemia  Next appointment: 02/22/2019 @ 10:45 am   Preventive Care 75 Years and Older, Male Preventive care refers to lifestyle choices and visits with your health care provider that can promote health and wellness. What does preventive care include?  A yearly physical exam. This is also called an annual well check.  Dental exams once or twice a year.  Routine eye exams. Ask your health care provider how often you should have your eyes checked.  Personal lifestyle choices, including:  Daily care of your teeth and gums.  Regular physical activity.  Eating a healthy diet.  Avoiding tobacco and drug use.  Limiting alcohol use.  Practicing safe sex.  Taking low doses of aspirin every day.  Taking vitamin and mineral supplements as recommended by your health care provider. What happens during an annual well check? The services and screenings done by your health care provider during your annual well check will depend on your age, overall health, lifestyle risk factors, and family history of  disease. Counseling  Your health care provider may ask you questions about your:  Alcohol use.  Tobacco use.  Drug use.  Emotional well-being.  Home and relationship well-being.  Sexual activity.  Eating habits.  History of falls.  Memory and ability to understand (cognition).  Work and work Statistician. Screening  You may have the following tests or measurements:  Height, weight, and BMI.  Blood pressure.  Lipid and cholesterol levels. These may be checked every 5 years, or more frequently if you are over 58 years old.  Skin check.  Lung cancer screening. You may have this screening every year starting at age 2 if you have a 30-pack-year history of smoking and currently smoke or have quit within the past 15 years.  Fecal occult blood test (FOBT) of the stool. You may have this test every year starting at age 77.  Flexible sigmoidoscopy or colonoscopy. You may have a sigmoidoscopy every 5 years or a colonoscopy every 10 years starting at age 70.  Prostate cancer screening. Recommendations will vary depending on your family history and other risks.  Hepatitis C blood test.  Hepatitis B blood test.  Sexually transmitted disease (STD) testing.  Diabetes screening. This is done by checking your blood sugar (glucose) after you have not eaten for a while (fasting). You may have this done every 1-3 years.  Abdominal aortic aneurysm (AAA) screening. You may need this if you are a current or former smoker.  Osteoporosis. You may be screened starting at age 17 if you are at high risk. Talk with your health care provider about your test results, treatment options, and if necessary, the need for more tests. Vaccines  Your health care  provider may recommend certain vaccines, such as:  Influenza vaccine. This is recommended every year.  Tetanus, diphtheria, and acellular pertussis (Tdap, Td) vaccine. You may need a Td booster every 10 years.  Zoster vaccine. You may  need this after age 60.  Pneumococcal 13-valent conjugate (PCV13) vaccine. One dose is recommended after age 1.  Pneumococcal polysaccharide (PPSV23) vaccine. One dose is recommended after age 37. Talk to your health care provider about which screenings and vaccines you need and how often you need them. This information is not intended to replace advice given to you by your health care provider. Make sure you discuss any questions you have with your health care provider. Document Released: 04/04/2015 Document Revised: 11/26/2015 Document Reviewed: 01/07/2015 Elsevier Interactive Patient Education  2017 [place] Prevention in the Home Falls can cause injuries. They can happen to people of all ages. There are many things you can do to make your home safe and to help prevent falls. What can I do on the outside of my home?  Regularly fix the edges of walkways and driveways and fix any cracks.  Remove anything that might make you trip as you walk through a door, such as a raised step or threshold.  Trim any bushes or trees on the path to your home.  Use bright outdoor lighting.  Clear any walking paths of anything that might make someone trip, such as rocks or tools.  Regularly check to see if handrails are loose or broken. Make sure that both sides of any steps have handrails.  Any raised decks and porches should have guardrails on the edges.  Have any leaves, snow, or ice cleared regularly.  Use sand or salt on walking paths during winter.  Clean up any spills in your garage right away. This includes oil or grease spills. What can I do in the bathroom?  Use night lights.  Install grab bars by the toilet and in the tub and shower. Do not use towel bars as grab bars.  Use non-skid mats or decals in the tub or shower.  If you need to sit down in the shower, use a plastic, non-slip stool.  Keep the floor dry. Clean up any water that spills on the floor as soon as it  happens.  Remove soap buildup in the tub or shower regularly.  Attach bath mats securely with double-sided non-slip rug tape.  Do not have throw rugs and other things on the floor that can make you trip. What can I do in the bedroom?  Use night lights.  Make sure that you have a light by your bed that is easy to reach.  Do not use any sheets or blankets that are too big for your bed. They should not hang down onto the floor.  Have a firm chair that has side arms. You can use this for support while you get dressed.  Do not have throw rugs and other things on the floor that can make you trip. What can I do in the kitchen?  Clean up any spills right away.  Avoid walking on wet floors.  Keep items that you use a lot in easy-to-reach places.  If you need to reach something above you, use a strong step stool that has a grab bar.  Keep electrical cords out of the way.  Do not use floor polish or wax that makes floors slippery. If you must use wax, use non-skid floor wax.  Do not have throw  rugs and other things on the floor that can make you trip. What can I do with my stairs?  Do not leave any items on the stairs.  Make sure that there are handrails on both sides of the stairs and use them. Fix handrails that are broken or loose. Make sure that handrails are as long as the stairways.  Check any carpeting to make sure that it is firmly attached to the stairs. Fix any carpet that is loose or worn.  Avoid having throw rugs at the top or bottom of the stairs. If you do have throw rugs, attach them to the floor with carpet tape.  Make sure that you have a light switch at the top of the stairs and the bottom of the stairs. If you do not have them, ask someone to add them for you. What else can I do to help prevent falls?  Wear shoes that:  Do not have high heels.  Have rubber bottoms.  Are comfortable and fit you well.  Are closed at the toe. Do not wear sandals.  If you  use a stepladder:  Make sure that it is fully opened. Do not climb a closed stepladder.  Make sure that both sides of the stepladder are locked into place.  Ask someone to hold it for you, if possible.  Clearly mark and make sure that you can see:  Any grab bars or handrails.  First and last steps.  Where the edge of each step is.  Use tools that help you move around (mobility aids) if they are needed. These include:  Canes.  Walkers.  Scooters.  Crutches.  Turn on the lights when you go into a dark area. Replace any light bulbs as soon as they burn out.  Set up your furniture so you have a clear path. Avoid moving your furniture around.  If any of your floors are uneven, fix them.  If there are any pets around you, be aware of where they are.  Review your medicines with your doctor. Some medicines can make you feel dizzy. This can increase your chance of falling. Ask your doctor what other things that you can do to help prevent falls. This information is not intended to replace advice given to you by your health care provider. Make sure you discuss any questions you have with your health care provider. Document Released: 01/02/2009 Document Revised: 08/14/2015 Document Reviewed: 04/12/2014 Elsevier Interactive Patient Education  2017 [employer].

## 2019-02-22 ENCOUNTER — Ambulatory Visit (INDEPENDENT_AMBULATORY_CARE_PROVIDER_SITE_OTHER): Payer: Medicare Other | Admitting: Family Medicine

## 2019-02-22 ENCOUNTER — Other Ambulatory Visit: Payer: Self-pay

## 2019-02-22 ENCOUNTER — Encounter: Payer: Self-pay | Admitting: Family Medicine

## 2019-02-22 DIAGNOSIS — I10 Essential (primary) hypertension: Secondary | ICD-10-CM | POA: Diagnosis not present

## 2019-02-22 DIAGNOSIS — Z8546 Personal history of malignant neoplasm of prostate: Secondary | ICD-10-CM

## 2019-02-22 DIAGNOSIS — Z0000 Encounter for general adult medical examination without abnormal findings: Secondary | ICD-10-CM

## 2019-02-22 DIAGNOSIS — Z7189 Other specified counseling: Secondary | ICD-10-CM

## 2019-02-22 DIAGNOSIS — E785 Hyperlipidemia, unspecified: Secondary | ICD-10-CM

## 2019-02-22 DIAGNOSIS — C61 Malignant neoplasm of prostate: Secondary | ICD-10-CM

## 2019-02-22 DIAGNOSIS — G231 Progressive supranuclear ophthalmoplegia [Steele-Richardson-Olszewski]: Secondary | ICD-10-CM

## 2019-02-22 DIAGNOSIS — R0989 Other specified symptoms and signs involving the circulatory and respiratory systems: Secondary | ICD-10-CM

## 2019-02-22 DIAGNOSIS — R6889 Other general symptoms and signs: Secondary | ICD-10-CM

## 2019-02-22 MED ORDER — TERAZOSIN HCL 2 MG PO CAPS: 6.0000 mg | ORAL_CAPSULE | Freq: Every day | ORAL | 3 refills | Status: AC

## 2019-02-22 MED ORDER — TERAZOSIN HCL 2 MG PO CAPS: 6.0000 mg | ORAL_CAPSULE | Freq: Every day | ORAL | Status: DC

## 2019-02-22 MED ORDER — SIMVASTATIN 40 MG PO TABS: 40.0000 mg | ORAL_TABLET | Freq: Every day | ORAL | 3 refills | Status: DC

## 2019-02-22 MED ORDER — COLCHICINE 0.6 MG PO TABS: ORAL_TABLET | ORAL | 3 refills | Status: AC

## 2019-02-22 MED ORDER — HYDROCHLOROTHIAZIDE 12.5 MG PO CAPS: 12.5000 mg | ORAL_CAPSULE | ORAL | 3 refills | Status: AC

## 2019-02-22 NOTE — Progress Notes (Signed)
This visit occurred during the SARS-CoV-2 public health emergency.  Safety protocols were in place, including screening questions prior to the visit, additional usage of staff PPE, and extensive cleaning of exam room while observing appropriate contact time as indicated for disinfecting solutions.   Progressive supranuclear palsy per neurology.  Off sinemet.  No change/improvement with cessation of Sinemet.  Variable mood.  Vision is worse.  More movement changes.  His memory seems to be less affected.  His operational function, getting dressed, eating, etc is worse due to motor changes.    Prostate cancer history noted.  PSA stable.  Previously seen by Dr. Eliberto Ivory with urology.  Labs discussed with patient.  He has a lot of throat drainage, having to clear his throat.  No fevers.  No vomiting.  He does not have a lot of GERD symptoms.  Hypertension:    Using medication without problems or lightheadedness: yes Chest pain with exertion:no Edema:no Short of breath:no Labs d/w pt.    Elevated Cholesterol: Using medications without problems: yes Muscle aches: no Diet compliance: yes Exercise: limited.   Flu vaccine-utd PNA-utd Tetanus 2009.   Colonoscopy 2016. PSA 0.08.   Advance directive- wife designated if patient were incapacitated.  PMH and SH reviewed  ROS: Per HPI unless specifically indicated in ROS section   Meds, vitals, and allergies reviewed.   GEN: nad, alert and oriented HEENT: ncat NECK: supple w/o LA CV: rrr PULM: ctab, no inc wob ABD: soft, +bs EXT: no edema SKIN: no acute rash Dyskinesia noted especially in the arms more than the legs.  Gait is slow.

## 2019-02-22 NOTE — Patient Instructions (Signed)
Don't change your meds for now.  I'll update urology and neurology.  We'll go from there.  Take care.  Glad to see you.

## 2019-02-26 DIAGNOSIS — R0989 Other specified symptoms and signs involving the circulatory and respiratory systems: Secondary | ICD-10-CM | POA: Insufficient documentation

## 2019-02-26 DIAGNOSIS — R6889 Other general symptoms and signs: Secondary | ICD-10-CM | POA: Insufficient documentation

## 2019-02-26 NOTE — Assessment & Plan Note (Signed)
Flu vaccine-utd PNA-utd Tetanus 2009.   Colonoscopy 2016. PSA 0.08.   Advance directive- wife designated if patient were incapacitated.

## 2019-02-26 NOTE — Assessment & Plan Note (Signed)
Prostate cancer history noted.  PSA stable.  Previously seen by Dr. Eliberto Ivory with urology.  Labs discussed with patient.  We will update urology.

## 2019-02-26 NOTE — Assessment & Plan Note (Signed)
Per neurology.  I did not change his medications today.  I will defer to neurology.  Patient agrees.  He and his wife are trying to compensate as much as possible given his movement difficulties.

## 2019-02-26 NOTE — Assessment & Plan Note (Signed)
I do not know how much of this is related to his PSP.  He can try Flonase if needed if he is having a lot of postnasal drip.

## 2019-02-26 NOTE — Assessment & Plan Note (Addendum)
No change in meds at this point.  I do not want to induce hypotension.  Labs discussed with patient.  They will update me as needed. >25 minutes spent in face to face time with patient, >50% spent in counselling or coordination of care

## 2019-02-26 NOTE — Assessment & Plan Note (Signed)
Reasonable control.  Labs discussed with patient.  Continue medication as is.

## 2019-02-26 NOTE — Assessment & Plan Note (Signed)
Advance directive- wife designated if patient were incapacitated.  

## 2019-05-08 DIAGNOSIS — R413 Other amnesia: Secondary | ICD-10-CM | POA: Diagnosis not present

## 2019-05-08 DIAGNOSIS — I10 Essential (primary) hypertension: Secondary | ICD-10-CM | POA: Diagnosis not present

## 2019-05-08 DIAGNOSIS — H539 Unspecified visual disturbance: Secondary | ICD-10-CM | POA: Diagnosis not present

## 2019-05-08 DIAGNOSIS — G479 Sleep disorder, unspecified: Secondary | ICD-10-CM | POA: Diagnosis not present

## 2019-05-08 DIAGNOSIS — H538 Other visual disturbances: Secondary | ICD-10-CM | POA: Diagnosis not present

## 2019-05-08 DIAGNOSIS — G231 Progressive supranuclear ophthalmoplegia [Steele-Richardson-Olszewski]: Secondary | ICD-10-CM | POA: Diagnosis not present

## 2019-07-09 DIAGNOSIS — C61 Malignant neoplasm of prostate: Secondary | ICD-10-CM | POA: Diagnosis not present

## 2019-07-09 DIAGNOSIS — D400 Neoplasm of uncertain behavior of prostate: Secondary | ICD-10-CM | POA: Diagnosis not present

## 2019-07-10 DIAGNOSIS — N390 Urinary tract infection, site not specified: Secondary | ICD-10-CM | POA: Diagnosis not present

## 2019-07-10 DIAGNOSIS — C61 Malignant neoplasm of prostate: Secondary | ICD-10-CM | POA: Diagnosis not present

## 2019-07-10 DIAGNOSIS — R3914 Feeling of incomplete bladder emptying: Secondary | ICD-10-CM | POA: Diagnosis not present

## 2019-07-10 DIAGNOSIS — D400 Neoplasm of uncertain behavior of prostate: Secondary | ICD-10-CM | POA: Diagnosis not present

## 2019-07-19 DIAGNOSIS — N401 Enlarged prostate with lower urinary tract symptoms: Secondary | ICD-10-CM | POA: Diagnosis not present

## 2019-07-24 DIAGNOSIS — N399 Disorder of urinary system, unspecified: Secondary | ICD-10-CM | POA: Diagnosis not present

## 2019-07-24 DIAGNOSIS — D400 Neoplasm of uncertain behavior of prostate: Secondary | ICD-10-CM | POA: Diagnosis not present

## 2019-07-24 DIAGNOSIS — N401 Enlarged prostate with lower urinary tract symptoms: Secondary | ICD-10-CM | POA: Diagnosis not present

## 2019-07-24 DIAGNOSIS — C61 Malignant neoplasm of prostate: Secondary | ICD-10-CM | POA: Diagnosis not present

## 2019-07-30 DIAGNOSIS — R413 Other amnesia: Secondary | ICD-10-CM | POA: Diagnosis not present

## 2019-07-30 DIAGNOSIS — R519 Headache, unspecified: Secondary | ICD-10-CM | POA: Diagnosis not present

## 2019-07-30 DIAGNOSIS — I10 Essential (primary) hypertension: Secondary | ICD-10-CM | POA: Diagnosis not present

## 2019-07-30 DIAGNOSIS — H538 Other visual disturbances: Secondary | ICD-10-CM | POA: Diagnosis not present

## 2019-07-30 DIAGNOSIS — H539 Unspecified visual disturbance: Secondary | ICD-10-CM | POA: Diagnosis not present

## 2019-07-30 DIAGNOSIS — G479 Sleep disorder, unspecified: Secondary | ICD-10-CM | POA: Diagnosis not present

## 2019-07-30 DIAGNOSIS — G231 Progressive supranuclear ophthalmoplegia [Steele-Richardson-Olszewski]: Secondary | ICD-10-CM | POA: Diagnosis not present

## 2019-08-03 DIAGNOSIS — G231 Progressive supranuclear ophthalmoplegia [Steele-Richardson-Olszewski]: Secondary | ICD-10-CM | POA: Diagnosis not present

## 2019-09-02 DIAGNOSIS — G231 Progressive supranuclear ophthalmoplegia [Steele-Richardson-Olszewski]: Secondary | ICD-10-CM | POA: Diagnosis not present

## 2019-10-01 DIAGNOSIS — G479 Sleep disorder, unspecified: Secondary | ICD-10-CM | POA: Diagnosis not present

## 2019-10-01 DIAGNOSIS — R413 Other amnesia: Secondary | ICD-10-CM | POA: Diagnosis not present

## 2019-10-01 DIAGNOSIS — I10 Essential (primary) hypertension: Secondary | ICD-10-CM | POA: Diagnosis not present

## 2019-10-01 DIAGNOSIS — G231 Progressive supranuclear ophthalmoplegia [Steele-Richardson-Olszewski]: Secondary | ICD-10-CM | POA: Diagnosis not present

## 2019-10-01 DIAGNOSIS — H539 Unspecified visual disturbance: Secondary | ICD-10-CM | POA: Diagnosis not present

## 2019-10-01 DIAGNOSIS — R519 Headache, unspecified: Secondary | ICD-10-CM | POA: Diagnosis not present

## 2019-10-06 DIAGNOSIS — R296 Repeated falls: Secondary | ICD-10-CM | POA: Diagnosis not present

## 2019-10-06 DIAGNOSIS — R1310 Dysphagia, unspecified: Secondary | ICD-10-CM | POA: Diagnosis not present

## 2019-10-06 DIAGNOSIS — K5790 Diverticulosis of intestine, part unspecified, without perforation or abscess without bleeding: Secondary | ICD-10-CM | POA: Diagnosis not present

## 2019-10-06 DIAGNOSIS — Z8546 Personal history of malignant neoplasm of prostate: Secondary | ICD-10-CM | POA: Diagnosis not present

## 2019-10-06 DIAGNOSIS — R2681 Unsteadiness on feet: Secondary | ICD-10-CM | POA: Diagnosis not present

## 2019-10-06 DIAGNOSIS — G231 Progressive supranuclear ophthalmoplegia [Steele-Richardson-Olszewski]: Secondary | ICD-10-CM | POA: Diagnosis not present

## 2019-10-06 DIAGNOSIS — M6281 Muscle weakness (generalized): Secondary | ICD-10-CM | POA: Diagnosis not present

## 2019-10-06 DIAGNOSIS — R413 Other amnesia: Secondary | ICD-10-CM | POA: Diagnosis not present

## 2019-10-06 DIAGNOSIS — M109 Gout, unspecified: Secondary | ICD-10-CM | POA: Diagnosis not present

## 2019-10-06 DIAGNOSIS — G479 Sleep disorder, unspecified: Secondary | ICD-10-CM | POA: Diagnosis not present

## 2019-10-06 DIAGNOSIS — H538 Other visual disturbances: Secondary | ICD-10-CM | POA: Diagnosis not present

## 2019-10-06 DIAGNOSIS — R519 Headache, unspecified: Secondary | ICD-10-CM | POA: Diagnosis not present

## 2019-10-06 DIAGNOSIS — I10 Essential (primary) hypertension: Secondary | ICD-10-CM | POA: Diagnosis not present

## 2019-10-06 DIAGNOSIS — G629 Polyneuropathy, unspecified: Secondary | ICD-10-CM | POA: Diagnosis not present

## 2019-10-11 DIAGNOSIS — G479 Sleep disorder, unspecified: Secondary | ICD-10-CM | POA: Diagnosis not present

## 2019-10-11 DIAGNOSIS — H538 Other visual disturbances: Secondary | ICD-10-CM | POA: Diagnosis not present

## 2019-10-11 DIAGNOSIS — M6281 Muscle weakness (generalized): Secondary | ICD-10-CM | POA: Diagnosis not present

## 2019-10-11 DIAGNOSIS — G231 Progressive supranuclear ophthalmoplegia [Steele-Richardson-Olszewski]: Secondary | ICD-10-CM | POA: Diagnosis not present

## 2019-10-11 DIAGNOSIS — R413 Other amnesia: Secondary | ICD-10-CM | POA: Diagnosis not present

## 2019-10-11 DIAGNOSIS — G629 Polyneuropathy, unspecified: Secondary | ICD-10-CM | POA: Diagnosis not present

## 2019-10-17 DIAGNOSIS — H538 Other visual disturbances: Secondary | ICD-10-CM | POA: Diagnosis not present

## 2019-10-17 DIAGNOSIS — M6281 Muscle weakness (generalized): Secondary | ICD-10-CM | POA: Diagnosis not present

## 2019-10-17 DIAGNOSIS — G629 Polyneuropathy, unspecified: Secondary | ICD-10-CM | POA: Diagnosis not present

## 2019-10-17 DIAGNOSIS — G231 Progressive supranuclear ophthalmoplegia [Steele-Richardson-Olszewski]: Secondary | ICD-10-CM | POA: Diagnosis not present

## 2019-10-17 DIAGNOSIS — R413 Other amnesia: Secondary | ICD-10-CM | POA: Diagnosis not present

## 2019-10-17 DIAGNOSIS — G479 Sleep disorder, unspecified: Secondary | ICD-10-CM | POA: Diagnosis not present

## 2019-10-24 DIAGNOSIS — G479 Sleep disorder, unspecified: Secondary | ICD-10-CM | POA: Diagnosis not present

## 2019-10-24 DIAGNOSIS — G231 Progressive supranuclear ophthalmoplegia [Steele-Richardson-Olszewski]: Secondary | ICD-10-CM | POA: Diagnosis not present

## 2019-10-24 DIAGNOSIS — R413 Other amnesia: Secondary | ICD-10-CM | POA: Diagnosis not present

## 2019-10-24 DIAGNOSIS — G629 Polyneuropathy, unspecified: Secondary | ICD-10-CM | POA: Diagnosis not present

## 2019-10-24 DIAGNOSIS — H538 Other visual disturbances: Secondary | ICD-10-CM | POA: Diagnosis not present

## 2019-10-24 DIAGNOSIS — M6281 Muscle weakness (generalized): Secondary | ICD-10-CM | POA: Diagnosis not present

## 2019-10-29 DIAGNOSIS — M6281 Muscle weakness (generalized): Secondary | ICD-10-CM | POA: Diagnosis not present

## 2019-10-29 DIAGNOSIS — R413 Other amnesia: Secondary | ICD-10-CM | POA: Diagnosis not present

## 2019-10-29 DIAGNOSIS — G629 Polyneuropathy, unspecified: Secondary | ICD-10-CM | POA: Diagnosis not present

## 2019-10-29 DIAGNOSIS — G231 Progressive supranuclear ophthalmoplegia [Steele-Richardson-Olszewski]: Secondary | ICD-10-CM | POA: Diagnosis not present

## 2019-10-29 DIAGNOSIS — H538 Other visual disturbances: Secondary | ICD-10-CM | POA: Diagnosis not present

## 2019-10-29 DIAGNOSIS — G479 Sleep disorder, unspecified: Secondary | ICD-10-CM | POA: Diagnosis not present

## 2019-11-05 DIAGNOSIS — M109 Gout, unspecified: Secondary | ICD-10-CM | POA: Diagnosis not present

## 2019-11-05 DIAGNOSIS — K5790 Diverticulosis of intestine, part unspecified, without perforation or abscess without bleeding: Secondary | ICD-10-CM | POA: Diagnosis not present

## 2019-11-05 DIAGNOSIS — G231 Progressive supranuclear ophthalmoplegia [Steele-Richardson-Olszewski]: Secondary | ICD-10-CM | POA: Diagnosis not present

## 2019-11-05 DIAGNOSIS — M6281 Muscle weakness (generalized): Secondary | ICD-10-CM | POA: Diagnosis not present

## 2019-11-05 DIAGNOSIS — H538 Other visual disturbances: Secondary | ICD-10-CM | POA: Diagnosis not present

## 2019-11-05 DIAGNOSIS — I10 Essential (primary) hypertension: Secondary | ICD-10-CM | POA: Diagnosis not present

## 2019-11-05 DIAGNOSIS — Z8546 Personal history of malignant neoplasm of prostate: Secondary | ICD-10-CM | POA: Diagnosis not present

## 2019-11-05 DIAGNOSIS — R296 Repeated falls: Secondary | ICD-10-CM | POA: Diagnosis not present

## 2019-11-05 DIAGNOSIS — R2681 Unsteadiness on feet: Secondary | ICD-10-CM | POA: Diagnosis not present

## 2019-11-05 DIAGNOSIS — R519 Headache, unspecified: Secondary | ICD-10-CM | POA: Diagnosis not present

## 2019-11-05 DIAGNOSIS — R1310 Dysphagia, unspecified: Secondary | ICD-10-CM | POA: Diagnosis not present

## 2019-11-05 DIAGNOSIS — G629 Polyneuropathy, unspecified: Secondary | ICD-10-CM | POA: Diagnosis not present

## 2019-11-05 DIAGNOSIS — R413 Other amnesia: Secondary | ICD-10-CM | POA: Diagnosis not present

## 2019-11-05 DIAGNOSIS — G479 Sleep disorder, unspecified: Secondary | ICD-10-CM | POA: Diagnosis not present

## 2019-11-15 DIAGNOSIS — H538 Other visual disturbances: Secondary | ICD-10-CM | POA: Diagnosis not present

## 2019-11-15 DIAGNOSIS — M6281 Muscle weakness (generalized): Secondary | ICD-10-CM | POA: Diagnosis not present

## 2019-11-15 DIAGNOSIS — G479 Sleep disorder, unspecified: Secondary | ICD-10-CM | POA: Diagnosis not present

## 2019-11-15 DIAGNOSIS — R413 Other amnesia: Secondary | ICD-10-CM | POA: Diagnosis not present

## 2019-11-15 DIAGNOSIS — G629 Polyneuropathy, unspecified: Secondary | ICD-10-CM | POA: Diagnosis not present

## 2019-11-15 DIAGNOSIS — G231 Progressive supranuclear ophthalmoplegia [Steele-Richardson-Olszewski]: Secondary | ICD-10-CM | POA: Diagnosis not present

## 2019-11-30 DIAGNOSIS — G231 Progressive supranuclear ophthalmoplegia [Steele-Richardson-Olszewski]: Secondary | ICD-10-CM | POA: Diagnosis not present

## 2019-11-30 DIAGNOSIS — G629 Polyneuropathy, unspecified: Secondary | ICD-10-CM | POA: Diagnosis not present

## 2019-11-30 DIAGNOSIS — G479 Sleep disorder, unspecified: Secondary | ICD-10-CM | POA: Diagnosis not present

## 2019-11-30 DIAGNOSIS — H538 Other visual disturbances: Secondary | ICD-10-CM | POA: Diagnosis not present

## 2019-11-30 DIAGNOSIS — R413 Other amnesia: Secondary | ICD-10-CM | POA: Diagnosis not present

## 2019-11-30 DIAGNOSIS — M6281 Muscle weakness (generalized): Secondary | ICD-10-CM | POA: Diagnosis not present

## 2019-12-31 DIAGNOSIS — H538 Other visual disturbances: Secondary | ICD-10-CM | POA: Diagnosis not present

## 2019-12-31 DIAGNOSIS — G231 Progressive supranuclear ophthalmoplegia [Steele-Richardson-Olszewski]: Secondary | ICD-10-CM | POA: Diagnosis not present

## 2019-12-31 DIAGNOSIS — I10 Essential (primary) hypertension: Secondary | ICD-10-CM | POA: Diagnosis not present

## 2019-12-31 DIAGNOSIS — G20 Parkinson's disease: Secondary | ICD-10-CM | POA: Diagnosis not present

## 2019-12-31 DIAGNOSIS — R519 Headache, unspecified: Secondary | ICD-10-CM | POA: Diagnosis not present

## 2019-12-31 DIAGNOSIS — H539 Unspecified visual disturbance: Secondary | ICD-10-CM | POA: Diagnosis not present

## 2019-12-31 DIAGNOSIS — R413 Other amnesia: Secondary | ICD-10-CM | POA: Diagnosis not present

## 2019-12-31 DIAGNOSIS — G479 Sleep disorder, unspecified: Secondary | ICD-10-CM | POA: Diagnosis not present

## 2020-01-04 ENCOUNTER — Telehealth: Payer: Self-pay

## 2020-01-04 NOTE — Telephone Encounter (Signed)
Tristan Booker with [facility] left v/m wanting to know if Dr Damita Dunnings would be the attending for pt with hospice. Pt is to be discharged soon from the hospital and pt will need hospice care upon discharge.

## 2020-01-07 NOTE — Telephone Encounter (Signed)
I can attend with hospice help, assuming the patient/family agrees to hospice admission.  Thanks.

## 2020-01-07 NOTE — Telephone Encounter (Signed)
Alwyn Ren with [facility] advised.

## 2020-01-09 DIAGNOSIS — Z6826 Body mass index (BMI) 26.0-26.9, adult: Secondary | ICD-10-CM | POA: Diagnosis not present

## 2020-01-09 DIAGNOSIS — Z8546 Personal history of malignant neoplasm of prostate: Secondary | ICD-10-CM | POA: Diagnosis not present

## 2020-01-09 DIAGNOSIS — E785 Hyperlipidemia, unspecified: Secondary | ICD-10-CM | POA: Diagnosis not present

## 2020-01-09 DIAGNOSIS — I10 Essential (primary) hypertension: Secondary | ICD-10-CM | POA: Diagnosis not present

## 2020-01-09 DIAGNOSIS — G231 Progressive supranuclear ophthalmoplegia [Steele-Richardson-Olszewski]: Secondary | ICD-10-CM | POA: Diagnosis not present

## 2020-01-09 DIAGNOSIS — M109 Gout, unspecified: Secondary | ICD-10-CM | POA: Diagnosis not present

## 2020-01-09 DIAGNOSIS — Z9079 Acquired absence of other genital organ(s): Secondary | ICD-10-CM | POA: Diagnosis not present

## 2020-01-10 DIAGNOSIS — Z8546 Personal history of malignant neoplasm of prostate: Secondary | ICD-10-CM | POA: Diagnosis not present

## 2020-01-10 DIAGNOSIS — E785 Hyperlipidemia, unspecified: Secondary | ICD-10-CM | POA: Diagnosis not present

## 2020-01-10 DIAGNOSIS — G231 Progressive supranuclear ophthalmoplegia [Steele-Richardson-Olszewski]: Secondary | ICD-10-CM | POA: Diagnosis not present

## 2020-01-10 DIAGNOSIS — Z6826 Body mass index (BMI) 26.0-26.9, adult: Secondary | ICD-10-CM | POA: Diagnosis not present

## 2020-01-10 DIAGNOSIS — I10 Essential (primary) hypertension: Secondary | ICD-10-CM | POA: Diagnosis not present

## 2020-01-10 DIAGNOSIS — Z9079 Acquired absence of other genital organ(s): Secondary | ICD-10-CM | POA: Diagnosis not present

## 2020-01-11 ENCOUNTER — Telehealth: Payer: Self-pay | Admitting: *Deleted

## 2020-01-11 DIAGNOSIS — Z6826 Body mass index (BMI) 26.0-26.9, adult: Secondary | ICD-10-CM | POA: Diagnosis not present

## 2020-01-11 DIAGNOSIS — E785 Hyperlipidemia, unspecified: Secondary | ICD-10-CM | POA: Diagnosis not present

## 2020-01-11 DIAGNOSIS — Z9079 Acquired absence of other genital organ(s): Secondary | ICD-10-CM | POA: Diagnosis not present

## 2020-01-11 DIAGNOSIS — I10 Essential (primary) hypertension: Secondary | ICD-10-CM | POA: Diagnosis not present

## 2020-01-11 DIAGNOSIS — Z8546 Personal history of malignant neoplasm of prostate: Secondary | ICD-10-CM | POA: Diagnosis not present

## 2020-01-11 DIAGNOSIS — G231 Progressive supranuclear ophthalmoplegia [Steele-Richardson-Olszewski]: Secondary | ICD-10-CM | POA: Diagnosis not present

## 2020-01-11 NOTE — Telephone Encounter (Signed)
Mitzi advised.

## 2020-01-11 NOTE — Telephone Encounter (Signed)
Mitzi with [facility] left a message at [place]. She would like a Verbal order to prescribe hyoscyamine 0.125 mg tablets for excessive drooling. With directions of taking 3-4 x daily prn. Pt tried the scopolamine patches but it dried out pt to much. Mitzi said they can take a verbal order no Rx needed to be sent in.  Mitzi CB# [phone]

## 2020-01-11 NOTE — Telephone Encounter (Signed)
Please give the order.  Thanks.  #[id number], 3rf if needed.

## 2020-01-14 DIAGNOSIS — E785 Hyperlipidemia, unspecified: Secondary | ICD-10-CM | POA: Diagnosis not present

## 2020-01-14 DIAGNOSIS — Z8546 Personal history of malignant neoplasm of prostate: Secondary | ICD-10-CM | POA: Diagnosis not present

## 2020-01-14 DIAGNOSIS — G231 Progressive supranuclear ophthalmoplegia [Steele-Richardson-Olszewski]: Secondary | ICD-10-CM | POA: Diagnosis not present

## 2020-01-14 DIAGNOSIS — I10 Essential (primary) hypertension: Secondary | ICD-10-CM | POA: Diagnosis not present

## 2020-01-14 DIAGNOSIS — Z9079 Acquired absence of other genital organ(s): Secondary | ICD-10-CM | POA: Diagnosis not present

## 2020-01-14 DIAGNOSIS — Z6826 Body mass index (BMI) 26.0-26.9, adult: Secondary | ICD-10-CM | POA: Diagnosis not present

## 2020-01-15 DIAGNOSIS — G231 Progressive supranuclear ophthalmoplegia [Steele-Richardson-Olszewski]: Secondary | ICD-10-CM | POA: Diagnosis not present

## 2020-01-15 DIAGNOSIS — I10 Essential (primary) hypertension: Secondary | ICD-10-CM | POA: Diagnosis not present

## 2020-01-15 DIAGNOSIS — Z6826 Body mass index (BMI) 26.0-26.9, adult: Secondary | ICD-10-CM | POA: Diagnosis not present

## 2020-01-15 DIAGNOSIS — E785 Hyperlipidemia, unspecified: Secondary | ICD-10-CM | POA: Diagnosis not present

## 2020-01-15 DIAGNOSIS — Z8546 Personal history of malignant neoplasm of prostate: Secondary | ICD-10-CM | POA: Diagnosis not present

## 2020-01-15 DIAGNOSIS — Z9079 Acquired absence of other genital organ(s): Secondary | ICD-10-CM | POA: Diagnosis not present

## 2020-01-17 ENCOUNTER — Other Ambulatory Visit: Payer: Self-pay | Admitting: *Deleted

## 2020-01-17 ENCOUNTER — Other Ambulatory Visit: Payer: Self-pay | Admitting: Family Medicine

## 2020-01-17 NOTE — Telephone Encounter (Signed)
Electronic refill request. Depakote Last office visit:   02/22/2019 Last Filled:    divalproex (DEPAKOTE) 125 MG DR tablet   01/01/2019    Sig: Take 125 mg twice a day   Class: Historical Med

## 2020-01-18 ENCOUNTER — Telehealth: Payer: Self-pay

## 2020-01-18 DIAGNOSIS — E785 Hyperlipidemia, unspecified: Secondary | ICD-10-CM | POA: Diagnosis not present

## 2020-01-18 DIAGNOSIS — Z6826 Body mass index (BMI) 26.0-26.9, adult: Secondary | ICD-10-CM | POA: Diagnosis not present

## 2020-01-18 DIAGNOSIS — I10 Essential (primary) hypertension: Secondary | ICD-10-CM | POA: Diagnosis not present

## 2020-01-18 DIAGNOSIS — Z9079 Acquired absence of other genital organ(s): Secondary | ICD-10-CM | POA: Diagnosis not present

## 2020-01-18 DIAGNOSIS — Z8546 Personal history of malignant neoplasm of prostate: Secondary | ICD-10-CM | POA: Diagnosis not present

## 2020-01-18 DIAGNOSIS — G231 Progressive supranuclear ophthalmoplegia [Steele-Richardson-Olszewski]: Secondary | ICD-10-CM | POA: Diagnosis not present

## 2020-01-18 MED ORDER — DIVALPROEX SODIUM 125 MG PO DR TAB: DELAYED_RELEASE_TABLET | ORAL | 2 refills | Status: AC

## 2020-01-18 NOTE — Telephone Encounter (Signed)
Duly noted.  If these meds (esp lorazepam) are not helping then please let me know or call the on call doc.  Thanks.

## 2020-01-18 NOTE — Telephone Encounter (Signed)
Tristan Booker with [facility] called triage line asking to speak to someone about medication approvals. I called to speak to her and she said she got approval for Dulcolax suppositories 10 mg prn, Milk of Magnesia 14ml 1-2 times daily prn, and lorazepam 0.5mg  tabs 1 tab q 2hrs prn anxiety or agitation. They think he is starting the transition phase. He is starting to yell out.   I have added these medications to his med list.

## 2020-01-18 NOTE — Telephone Encounter (Signed)
I sent this medication preemptively because I do not want him to run out but please verify this med/dose with hospice.  Thanks.

## 2020-01-19 DIAGNOSIS — Z9079 Acquired absence of other genital organ(s): Secondary | ICD-10-CM | POA: Diagnosis not present

## 2020-01-19 DIAGNOSIS — Z8546 Personal history of malignant neoplasm of prostate: Secondary | ICD-10-CM | POA: Diagnosis not present

## 2020-01-19 DIAGNOSIS — Z6826 Body mass index (BMI) 26.0-26.9, adult: Secondary | ICD-10-CM | POA: Diagnosis not present

## 2020-01-19 DIAGNOSIS — E785 Hyperlipidemia, unspecified: Secondary | ICD-10-CM | POA: Diagnosis not present

## 2020-01-19 DIAGNOSIS — I10 Essential (primary) hypertension: Secondary | ICD-10-CM | POA: Diagnosis not present

## 2020-01-19 DIAGNOSIS — G231 Progressive supranuclear ophthalmoplegia [Steele-Richardson-Olszewski]: Secondary | ICD-10-CM | POA: Diagnosis not present

## 2020-01-21 DIAGNOSIS — I10 Essential (primary) hypertension: Secondary | ICD-10-CM | POA: Diagnosis not present

## 2020-01-21 DIAGNOSIS — Z9079 Acquired absence of other genital organ(s): Secondary | ICD-10-CM | POA: Diagnosis not present

## 2020-01-21 DIAGNOSIS — E785 Hyperlipidemia, unspecified: Secondary | ICD-10-CM | POA: Diagnosis not present

## 2020-01-21 DIAGNOSIS — G231 Progressive supranuclear ophthalmoplegia [Steele-Richardson-Olszewski]: Secondary | ICD-10-CM | POA: Diagnosis not present

## 2020-01-21 DIAGNOSIS — Z8546 Personal history of malignant neoplasm of prostate: Secondary | ICD-10-CM | POA: Diagnosis not present

## 2020-01-21 DIAGNOSIS — Z6826 Body mass index (BMI) 26.0-26.9, adult: Secondary | ICD-10-CM | POA: Diagnosis not present

## 2020-01-21 DIAGNOSIS — M109 Gout, unspecified: Secondary | ICD-10-CM | POA: Diagnosis not present

## 2020-01-22 DIAGNOSIS — Z9079 Acquired absence of other genital organ(s): Secondary | ICD-10-CM | POA: Diagnosis not present

## 2020-01-22 DIAGNOSIS — I10 Essential (primary) hypertension: Secondary | ICD-10-CM | POA: Diagnosis not present

## 2020-01-22 DIAGNOSIS — Z8546 Personal history of malignant neoplasm of prostate: Secondary | ICD-10-CM | POA: Diagnosis not present

## 2020-01-22 DIAGNOSIS — Z6826 Body mass index (BMI) 26.0-26.9, adult: Secondary | ICD-10-CM | POA: Diagnosis not present

## 2020-01-22 DIAGNOSIS — G231 Progressive supranuclear ophthalmoplegia [Steele-Richardson-Olszewski]: Secondary | ICD-10-CM | POA: Diagnosis not present

## 2020-01-22 DIAGNOSIS — E785 Hyperlipidemia, unspecified: Secondary | ICD-10-CM | POA: Diagnosis not present

## 2020-01-23 DIAGNOSIS — Z9079 Acquired absence of other genital organ(s): Secondary | ICD-10-CM | POA: Diagnosis not present

## 2020-01-23 DIAGNOSIS — G231 Progressive supranuclear ophthalmoplegia [Steele-Richardson-Olszewski]: Secondary | ICD-10-CM | POA: Diagnosis not present

## 2020-01-23 DIAGNOSIS — Z8546 Personal history of malignant neoplasm of prostate: Secondary | ICD-10-CM | POA: Diagnosis not present

## 2020-01-23 DIAGNOSIS — Z6826 Body mass index (BMI) 26.0-26.9, adult: Secondary | ICD-10-CM | POA: Diagnosis not present

## 2020-01-23 DIAGNOSIS — E785 Hyperlipidemia, unspecified: Secondary | ICD-10-CM | POA: Diagnosis not present

## 2020-01-23 DIAGNOSIS — I10 Essential (primary) hypertension: Secondary | ICD-10-CM | POA: Diagnosis not present

## 2020-01-24 DIAGNOSIS — Z8546 Personal history of malignant neoplasm of prostate: Secondary | ICD-10-CM | POA: Diagnosis not present

## 2020-01-24 DIAGNOSIS — E785 Hyperlipidemia, unspecified: Secondary | ICD-10-CM | POA: Diagnosis not present

## 2020-01-24 DIAGNOSIS — Z6826 Body mass index (BMI) 26.0-26.9, adult: Secondary | ICD-10-CM | POA: Diagnosis not present

## 2020-01-24 DIAGNOSIS — Z9079 Acquired absence of other genital organ(s): Secondary | ICD-10-CM | POA: Diagnosis not present

## 2020-01-24 DIAGNOSIS — I10 Essential (primary) hypertension: Secondary | ICD-10-CM | POA: Diagnosis not present

## 2020-01-24 DIAGNOSIS — G231 Progressive supranuclear ophthalmoplegia [Steele-Richardson-Olszewski]: Secondary | ICD-10-CM | POA: Diagnosis not present

## 2020-01-25 DIAGNOSIS — I10 Essential (primary) hypertension: Secondary | ICD-10-CM | POA: Diagnosis not present

## 2020-01-25 DIAGNOSIS — Z8546 Personal history of malignant neoplasm of prostate: Secondary | ICD-10-CM | POA: Diagnosis not present

## 2020-01-25 DIAGNOSIS — Z6826 Body mass index (BMI) 26.0-26.9, adult: Secondary | ICD-10-CM | POA: Diagnosis not present

## 2020-01-25 DIAGNOSIS — Z9079 Acquired absence of other genital organ(s): Secondary | ICD-10-CM | POA: Diagnosis not present

## 2020-01-25 DIAGNOSIS — G231 Progressive supranuclear ophthalmoplegia [Steele-Richardson-Olszewski]: Secondary | ICD-10-CM | POA: Diagnosis not present

## 2020-01-25 DIAGNOSIS — E785 Hyperlipidemia, unspecified: Secondary | ICD-10-CM | POA: Diagnosis not present

## 2020-01-28 ENCOUNTER — Telehealth: Payer: Self-pay | Admitting: Family Medicine

## 2020-01-28 NOTE — Telephone Encounter (Signed)
Called pt's wife.  Thanked her for taking the call.  I was always glad to see this kind gentleman in clinic.  She thanked me for the call.

## 2020-02-20 DEATH — deceased
